# Patient Record
Sex: Male | Born: 1950
Health system: Southern US, Community
[De-identification: ages and names within clinical notes are randomized; demographics above are authoritative.]

## PROBLEM LIST (undated history)

## (undated) DIAGNOSIS — B1920 Unspecified viral hepatitis C without hepatic coma: Secondary | ICD-10-CM

## (undated) DIAGNOSIS — J189 Pneumonia, unspecified organism: Secondary | ICD-10-CM

## (undated) DIAGNOSIS — M1990 Unspecified osteoarthritis, unspecified site: Secondary | ICD-10-CM

## (undated) DIAGNOSIS — Z201 Contact with and (suspected) exposure to tuberculosis: Secondary | ICD-10-CM

## (undated) HISTORY — PX: TOTAL KNEE ARTHROPLASTY: SHX125

## (undated) HISTORY — PX: ABDOMINAL EXPLORATION SURGERY: SHX538

## (undated) HISTORY — PX: JOINT REPLACEMENT: SHX530

## (undated) HISTORY — PX: FRACTURE SURGERY: SHX138

---
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
## 2004-12-31 ENCOUNTER — Emergency Department: Payer: Self-pay | Admitting: Unknown Physician Specialty

## 2016-04-08 ENCOUNTER — Emergency Department: Payer: Medicare Other

## 2016-04-08 ENCOUNTER — Emergency Department
Admission: EM | Admit: 2016-04-08 | Discharge: 2016-04-09 | Disposition: A | Discharge status: Home / Self Care | Payer: Medicare Other | Attending: Emergency Medicine | Admitting: Emergency Medicine

## 2016-04-08 DIAGNOSIS — F29 Unspecified psychosis not due to a substance or known physiological condition: Secondary | ICD-10-CM | POA: Diagnosis not present

## 2016-04-08 DIAGNOSIS — Z5181 Encounter for therapeutic drug level monitoring: Secondary | ICD-10-CM | POA: Insufficient documentation

## 2016-04-08 DIAGNOSIS — R4182 Altered mental status, unspecified: Secondary | ICD-10-CM

## 2016-04-08 DIAGNOSIS — F1910 Other psychoactive substance abuse, uncomplicated: Secondary | ICD-10-CM | POA: Diagnosis not present

## 2016-04-08 LAB — CBC
HEMATOCRIT: 41 % (ref 40.0–52.0)
HEMOGLOBIN: 13.4 g/dL (ref 13.0–18.0)
MCH: 30.9 pg (ref 26.0–34.0)
MCHC: 32.7 g/dL (ref 32.0–36.0)
MCV: 94.6 fL (ref 80.0–100.0)
Platelets: 132 10*3/uL — ABNORMAL LOW (ref 150–440)
RBC: 4.33 MIL/uL — AB (ref 4.40–5.90)
RDW: 15.2 % — AB (ref 11.5–14.5)
WBC: 2.5 10*3/uL — ABNORMAL LOW (ref 3.8–10.6)

## 2016-04-08 MED ORDER — ZIPRASIDONE MESYLATE 20 MG IM SOLR
INTRAMUSCULAR | Status: AC
  Administered 2016-04-08: 20 mg via INTRAMUSCULAR
  Filled 2016-04-08: qty 20

## 2016-04-08 MED ORDER — ZIPRASIDONE MESYLATE 20 MG IM SOLR
20.0000 mg | Freq: Once | INTRAMUSCULAR | Status: AC
  Administered 2016-04-08: 20 mg via INTRAMUSCULAR

## 2016-04-08 NOTE — ED Notes (Signed)
Patient transported to CT with security escort

## 2016-04-08 NOTE — ED Notes (Signed)
Contact info given to family

## 2016-04-08 NOTE — ED Provider Notes (Addendum)
[facility] Emergency Department Provider Note   ____________________________________________    I have reviewed the triage vital signs and the nursing notes.   HISTORY  Chief Complaint Altered Mental Status   History limited  HPI Stephen Rivera is a 66 y.o. male who presents with altered mental status. He is belligerent and aggressive in trying to strike the staff. Per EMS the patient went to the police department and complained about his neighbors and when they asked for more information he became belligerent with the police and left and then promptly fell asleep on a bench outside. Family was reportedly trying to drag him into his car when EMS arrived   No past medical history on file.  There are no active problems to display for this patient.   No past surgical history on file.  Prior to Admission medications   Not on File     Allergies Patient has no known allergies.  No family history on file.  Social History Unavailable because of patient's altered mental status  Level V caveat: Unable to obtain due to altered mental status Review of Systems    ____________________________________________   PHYSICAL EXAM:  VITAL SIGNS: ED Triage Vitals  Enc Vitals Group     BP      Pulse      Resp      Temp      Temp src      SpO2      Weight      Height      Head Circumference      Peak Flow      Pain Score      Pain Loc      Pain Edu?      Excl. in GC?     Constitutional: Alert, Screaming at staff altered mental status thinks that we want to experiment on him Eyes: Conjunctivae are normal.  Head: Atraumatic. Nose: No congestion/rhinnorhea. Mouth/Throat: Mucous membranes are moist.    Cardiovascular: Normal rate, regular rhythm. Grossly normal heart sounds.  Good peripheral circulation. Respiratory: Normal respiratory effort.  No retractions. Lungs CTAB. Gastrointestinal: Soft and nontender. No distention.  No CVA  tenderness.  Musculoskeletal:   Warm and well perfused Neurologic:   No gross focal neurologic deficits are appreciated.  Skin:  Skin is warm, dry and intact. No rash noted. Psychiatric: As above  ____________________________________________   LABS (all labs ordered are listed, but only abnormal results are displayed)  Labs Reviewed - No data to display ____________________________________________  EKG  none ____________________________________________  RADIOLOGY  CT pending ____________________________________________   PROCEDURES  Procedure(s) performed: No    Critical Care performed: No ____________________________________________   INITIAL IMPRESSION / ASSESSMENT AND PLAN / ED COURSE  Pertinent labs & imaging results that were available during my care of the patient were reviewed by me and considered in my medical decision making (see chart for details).  Patient aggressive and threatening towards staff. I will give him 20 mg of Geodon IM for his and our safety. He will require involuntary commitment.  ----------------------------------------- 11:12 PM on 04/08/2016 -----------------------------------------  Patient now resting quietly    ____________________________________________   FINAL CLINICAL IMPRESSION(S) / ED DIAGNOSES  Final diagnoses:  Psychosis, unspecified psychosis type      NEW MEDICATIONS STARTED DURING THIS VISIT:  New Prescriptions   No medications on file     Note:  This document was prepared using Dragon voice recognition software and may include unintentional dictation errors.  Jene Every, MD 04/08/16 0981    Jene Every, MD 04/08/16 [phone]

## 2016-04-09 ENCOUNTER — Encounter: Payer: Self-pay | Admitting: Emergency Medicine

## 2016-04-09 LAB — COMPREHENSIVE METABOLIC PANEL
ALT: 21 U/L (ref 17–63)
AST: 34 U/L (ref 15–41)
Albumin: 4.1 g/dL (ref 3.5–5.0)
Alkaline Phosphatase: 32 U/L — ABNORMAL LOW (ref 38–126)
Anion gap: 12 (ref 5–15)
BILIRUBIN TOTAL: 0.6 mg/dL (ref 0.3–1.2)
BUN: 11 mg/dL (ref 6–20)
CO2: 23 mmol/L (ref 22–32)
CREATININE: 1.22 mg/dL (ref 0.61–1.24)
Calcium: 9 mg/dL (ref 8.9–10.3)
Chloride: 102 mmol/L (ref 101–111)
GFR calc Af Amer: >60 mL/min (ref >60)
GFR calc non Af Amer: >60 mL/min (ref >60)
Glucose, Bld: 87 mg/dL (ref 65–99)
Potassium: 3.3 mmol/L — ABNORMAL LOW (ref 3.5–5.1)
Sodium: 137 mmol/L (ref 135–145)
TOTAL PROTEIN: 7.9 g/dL (ref 6.5–8.1)

## 2016-04-09 LAB — URINE DRUG SCREEN, QUALITATIVE (ARMC ONLY)
Amphetamines, Ur Screen: NOT DETECTED
BARBITURATES, UR SCREEN: NOT DETECTED
BENZODIAZEPINE, UR SCRN: NOT DETECTED
CANNABINOID 50 NG, UR ~~LOC~~: NOT DETECTED
Cocaine Metabolite,Ur ~~LOC~~: POSITIVE — AB
MDMA (Ecstasy)Ur Screen: NOT DETECTED
Methadone Scn, Ur: NOT DETECTED
OPIATE, UR SCREEN: NOT DETECTED
PHENCYCLIDINE (PCP) UR S: NOT DETECTED
Tricyclic, Ur Screen: NOT DETECTED

## 2016-04-09 LAB — TROPONIN I: Troponin I: <0.03 ng/mL (ref <0.03)

## 2016-04-09 LAB — CK: Total CK: 241 U/L (ref 49–397)

## 2016-04-09 LAB — ETHANOL: Alcohol, Ethyl (B): 16 mg/dL — ABNORMAL HIGH (ref <5)

## 2016-04-09 LAB — ACETAMINOPHEN LEVEL: Acetaminophen (Tylenol), Serum: <10 ug/mL — ABNORMAL LOW (ref 10–30)

## 2016-04-09 NOTE — ED Notes (Signed)
Pt sleeping soundly, even and unlabored respirations, will continue to monitor  

## 2016-04-09 NOTE — Progress Notes (Signed)
TTS spoke with RN Danelle Earthly, who spoke with ER MD who is requesting to wait on TTS as pt is still asleep and the would like to allow him to sleep as long as he can and assess on day shift. Garner Nash, MSW, LCSW Clinical Social Worker 04/09/2016 5:52 AM

## 2016-04-09 NOTE — ED Triage Notes (Signed)
Patient presents to Emergency Department via [facility] with complaints of being unresponsive in the [facility] [employer] office.  Per EMS pt came into the police office to report "people drug dealing"; then became belligerent (cussing) when LE attempted to help, finally entering a state of near catatonia.  EMS reports  CBG 104, BP 146/73, 58 NSR, withdrew from axial temp probe.    Pt withdraws from pain.

## 2016-04-09 NOTE — ED Notes (Signed)
Pt awake , calm, cooperative, even unlabored respiration, skin warm and dry. No distress , breakfast tray given

## 2016-04-09 NOTE — Progress Notes (Signed)
TTS spoke with RN for pt around 2330: pt was being restrained by multiple ED staff.  Pt subsequently given geodon.  TTS went and check on pt around 0200 and pt still sleeping. Daleen Squibb, LCSW, TTS counselor

## 2016-04-09 NOTE — Discharge Instructions (Signed)
Your urine tested positive for cocaine. Please make sure you're not using any drugs. If [facility] need help [facility] excellent [facility] to help [facility] with [facility]. Please follow-up with your doctor at the [facility] to check the 2 spots that turned up on your chest x-ray. [facility]. Please return here for any further problems

## 2016-04-09 NOTE — ED Notes (Addendum)
While attempting to draw blood pt suddenly woke and began cussing, kicking and swatting at staff.  Marcelino Duster, RN and RN student Delice Bison were the first to be swatted at, pt's extremities were safely restrained and attempts were repeatedly made to orient pt to care.  Pt continuously screamed that staff was torturing him, "doing experiments" and "trying to kill me", pt reports that he "won the lottery" to get in the [employer], answers all questions with "what the fuck do you think?",   pt labile, unresponsive then very active, aggressive and verbally abusive, paranoidal acussations and fabrications  Dr Cyril Loosen aware, orders recieved

## 2016-04-09 NOTE — ED Notes (Signed)
Pt lying calmly without intervention, appears to be more relaxed
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
## 2016-04-09 NOTE — ED Notes (Signed)
Greg, TTS, called, per EDP allow pt to sleep, assess once awake

## 2016-04-09 NOTE — ED Notes (Signed)
Pt with snoring respirations, even unlabored respirations no distress

## 2016-04-09 NOTE — ED Notes (Signed)
SOC consult in progress.  

## 2016-04-09 NOTE — ED Notes (Signed)
Pt changed out into paper scrubs, pt calm/cooperative, blue jeans, boxer shorts, brown belt ,  Two silver colored class ring type rings, black/red sweat shirt left sleeve torn, white under shirt long sleeve with left sleeve torn, short sleeve t-shirt intact. Pt requested that the two rings be kept with his bag of belongings

## 2016-04-09 NOTE — ED Notes (Signed)
Lunch tray provided. 

## 2016-04-09 NOTE — ED Notes (Signed)
SOC report received , IVC reversal is expected ,pt currently calm cooperative, alert and oriented

## 2016-04-09 NOTE — ED Notes (Signed)
Calvin TTS speaking with patient , pt remains calm and cooperative , no distress
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
## 2016-12-10 ENCOUNTER — Emergency Department (HOSPITAL_COMMUNITY): Payer: Medicare Other

## 2016-12-10 ENCOUNTER — Other Ambulatory Visit: Payer: Self-pay

## 2016-12-10 ENCOUNTER — Inpatient Hospital Stay (HOSPITAL_COMMUNITY): Payer: Medicare Other

## 2016-12-10 ENCOUNTER — Inpatient Hospital Stay (HOSPITAL_COMMUNITY)
Admission: EM | Admit: 2016-12-10 | Discharge: 2016-12-19 | DRG: 956 | Disposition: A | Discharge status: Skilled Nursing Facility | Payer: Medicare Other | Attending: Student | Admitting: Student

## 2016-12-10 ENCOUNTER — Encounter (HOSPITAL_COMMUNITY): Payer: Self-pay | Admitting: Radiology

## 2016-12-10 DIAGNOSIS — R0902 Hypoxemia: Secondary | ICD-10-CM | POA: Diagnosis not present

## 2016-12-10 DIAGNOSIS — S2232XA Fracture of one rib, left side, initial encounter for closed fracture: Secondary | ICD-10-CM | POA: Diagnosis present

## 2016-12-10 DIAGNOSIS — R0602 Shortness of breath: Secondary | ICD-10-CM | POA: Diagnosis not present

## 2016-12-10 DIAGNOSIS — S42009A Fracture of unspecified part of unspecified clavicle, initial encounter for closed fracture: Secondary | ICD-10-CM | POA: Diagnosis not present

## 2016-12-10 DIAGNOSIS — S82202A Unspecified fracture of shaft of left tibia, initial encounter for closed fracture: Secondary | ICD-10-CM

## 2016-12-10 DIAGNOSIS — T148XXA Other injury of unspecified body region, initial encounter: Secondary | ICD-10-CM

## 2016-12-10 DIAGNOSIS — S0990XA Unspecified injury of head, initial encounter: Secondary | ICD-10-CM | POA: Diagnosis not present

## 2016-12-10 DIAGNOSIS — G8911 Acute pain due to trauma: Secondary | ICD-10-CM | POA: Diagnosis not present

## 2016-12-10 DIAGNOSIS — S82102A Unspecified fracture of upper end of left tibia, initial encounter for closed fracture: Secondary | ICD-10-CM | POA: Diagnosis not present

## 2016-12-10 DIAGNOSIS — S79911A Unspecified injury of right hip, initial encounter: Secondary | ICD-10-CM | POA: Diagnosis present

## 2016-12-10 DIAGNOSIS — S199XXA Unspecified injury of neck, initial encounter: Secondary | ICD-10-CM | POA: Diagnosis not present

## 2016-12-10 DIAGNOSIS — M1990 Unspecified osteoarthritis, unspecified site: Secondary | ICD-10-CM | POA: Diagnosis present

## 2016-12-10 DIAGNOSIS — M978XXA Periprosthetic fracture around other internal prosthetic joint, initial encounter: Secondary | ICD-10-CM

## 2016-12-10 DIAGNOSIS — Z96652 Presence of left artificial knee joint: Secondary | ICD-10-CM | POA: Diagnosis present

## 2016-12-10 DIAGNOSIS — S82252A Displaced comminuted fracture of shaft of left tibia, initial encounter for closed fracture: Secondary | ICD-10-CM | POA: Diagnosis not present

## 2016-12-10 DIAGNOSIS — D649 Anemia, unspecified: Secondary | ICD-10-CM | POA: Diagnosis not present

## 2016-12-10 DIAGNOSIS — S72141A Displaced intertrochanteric fracture of right femur, initial encounter for closed fracture: Secondary | ICD-10-CM | POA: Diagnosis not present

## 2016-12-10 DIAGNOSIS — F10129 Alcohol abuse with intoxication, unspecified: Secondary | ICD-10-CM | POA: Diagnosis present

## 2016-12-10 DIAGNOSIS — S82452A Displaced comminuted fracture of shaft of left fibula, initial encounter for closed fracture: Secondary | ICD-10-CM | POA: Diagnosis present

## 2016-12-10 DIAGNOSIS — S7290XA Unspecified fracture of unspecified femur, initial encounter for closed fracture: Secondary | ICD-10-CM

## 2016-12-10 DIAGNOSIS — S299XXA Unspecified injury of thorax, initial encounter: Secondary | ICD-10-CM | POA: Diagnosis not present

## 2016-12-10 DIAGNOSIS — S82402A Unspecified fracture of shaft of left fibula, initial encounter for closed fracture: Secondary | ICD-10-CM | POA: Diagnosis not present

## 2016-12-10 DIAGNOSIS — Z419 Encounter for procedure for purposes other than remedying health state, unspecified: Secondary | ICD-10-CM

## 2016-12-10 DIAGNOSIS — S3991XA Unspecified injury of abdomen, initial encounter: Secondary | ICD-10-CM | POA: Diagnosis not present

## 2016-12-10 DIAGNOSIS — M79605 Pain in left leg: Secondary | ICD-10-CM | POA: Diagnosis not present

## 2016-12-10 DIAGNOSIS — J939 Pneumothorax, unspecified: Secondary | ICD-10-CM

## 2016-12-10 DIAGNOSIS — S42017A Nondisplaced fracture of sternal end of right clavicle, initial encounter for closed fracture: Secondary | ICD-10-CM | POA: Diagnosis not present

## 2016-12-10 DIAGNOSIS — S82831A Other fracture of upper and lower end of right fibula, initial encounter for closed fracture: Secondary | ICD-10-CM | POA: Diagnosis not present

## 2016-12-10 DIAGNOSIS — S270XXA Traumatic pneumothorax, initial encounter: Principal | ICD-10-CM | POA: Diagnosis present

## 2016-12-10 DIAGNOSIS — Y92410 Unspecified street and highway as the place of occurrence of the external cause: Secondary | ICD-10-CM | POA: Diagnosis not present

## 2016-12-10 DIAGNOSIS — S99922A Unspecified injury of left foot, initial encounter: Secondary | ICD-10-CM | POA: Diagnosis not present

## 2016-12-10 DIAGNOSIS — Z96659 Presence of unspecified artificial knee joint: Secondary | ICD-10-CM

## 2016-12-10 DIAGNOSIS — M9712XA Periprosthetic fracture around internal prosthetic left knee joint, initial encounter: Secondary | ICD-10-CM | POA: Diagnosis present

## 2016-12-10 DIAGNOSIS — S161XXA Strain of muscle, fascia and tendon at neck level, initial encounter: Secondary | ICD-10-CM

## 2016-12-10 DIAGNOSIS — S72009A Fracture of unspecified part of neck of unspecified femur, initial encounter for closed fracture: Secondary | ICD-10-CM | POA: Diagnosis present

## 2016-12-10 DIAGNOSIS — S728X9A Other fracture of unspecified femur, initial encounter for closed fracture: Secondary | ICD-10-CM | POA: Diagnosis not present

## 2016-12-10 DIAGNOSIS — F10929 Alcohol use, unspecified with intoxication, unspecified: Secondary | ICD-10-CM

## 2016-12-10 DIAGNOSIS — S2249XA Multiple fractures of ribs, unspecified side, initial encounter for closed fracture: Secondary | ICD-10-CM

## 2016-12-10 DIAGNOSIS — M25551 Pain in right hip: Secondary | ICD-10-CM | POA: Diagnosis not present

## 2016-12-10 DIAGNOSIS — D696 Thrombocytopenia, unspecified: Secondary | ICD-10-CM | POA: Diagnosis not present

## 2016-12-10 DIAGNOSIS — S2242XA Multiple fractures of ribs, left side, initial encounter for closed fracture: Secondary | ICD-10-CM | POA: Diagnosis not present

## 2016-12-10 DIAGNOSIS — S72001A Fracture of unspecified part of neck of right femur, initial encounter for closed fracture: Secondary | ICD-10-CM

## 2016-12-10 DIAGNOSIS — S42013A Anterior displaced fracture of sternal end of unspecified clavicle, initial encounter for closed fracture: Secondary | ICD-10-CM | POA: Diagnosis not present

## 2016-12-10 DIAGNOSIS — R0789 Other chest pain: Secondary | ICD-10-CM | POA: Diagnosis not present

## 2016-12-10 DIAGNOSIS — S82832A Other fracture of upper and lower end of left fibula, initial encounter for closed fracture: Secondary | ICD-10-CM | POA: Diagnosis not present

## 2016-12-10 DIAGNOSIS — Z23 Encounter for immunization: Secondary | ICD-10-CM

## 2016-12-10 DIAGNOSIS — R918 Other nonspecific abnormal finding of lung field: Secondary | ICD-10-CM | POA: Diagnosis not present

## 2016-12-10 DIAGNOSIS — S72011A Unspecified intracapsular fracture of right femur, initial encounter for closed fracture: Secondary | ICD-10-CM | POA: Diagnosis not present

## 2016-12-10 DIAGNOSIS — T1490XA Injury, unspecified, initial encounter: Secondary | ICD-10-CM

## 2016-12-10 DIAGNOSIS — S82155A Nondisplaced fracture of left tibial tuberosity, initial encounter for closed fracture: Secondary | ICD-10-CM | POA: Diagnosis not present

## 2016-12-10 DIAGNOSIS — S42001A Fracture of unspecified part of right clavicle, initial encounter for closed fracture: Secondary | ICD-10-CM | POA: Diagnosis not present

## 2016-12-10 HISTORY — DX: Unspecified osteoarthritis, unspecified site: M19.90

## 2016-12-10 HISTORY — DX: Contact with and (suspected) exposure to tuberculosis: Z20.1

## 2016-12-10 HISTORY — DX: Pneumonia, unspecified organism: J18.9

## 2016-12-10 HISTORY — DX: Unspecified viral hepatitis C without hepatic coma: B19.20

## 2016-12-10 LAB — I-STAT CHEM 8, ED
BUN: 20 mg/dL (ref 6–20)
CALCIUM ION: 0.99 mmol/L — AB (ref 1.15–1.40)
CHLORIDE: 102 mmol/L (ref 101–111)
Creatinine, Ser: 1.2 mg/dL (ref 0.61–1.24)
Glucose, Bld: 101 mg/dL — ABNORMAL HIGH (ref 65–99)
HEMATOCRIT: 44 % (ref 39.0–52.0)
Hemoglobin: 15 g/dL (ref 13.0–17.0)
POTASSIUM: 4.6 mmol/L (ref 3.5–5.1)
SODIUM: 138 mmol/L (ref 135–145)
TCO2: 25 mmol/L (ref 22–32)

## 2016-12-10 LAB — COMPREHENSIVE METABOLIC PANEL
ALBUMIN: 3.7 g/dL (ref 3.5–5.0)
ALK PHOS: 53 U/L (ref 38–126)
ALT: 39 U/L (ref 17–63)
ANION GAP: 13 (ref 5–15)
AST: 97 U/L — ABNORMAL HIGH (ref 15–41)
BILIRUBIN TOTAL: 0.6 mg/dL (ref 0.3–1.2)
BUN: 13 mg/dL (ref 6–20)
CALCIUM: 8.8 mg/dL — AB (ref 8.9–10.3)
CO2: 24 mmol/L (ref 22–32)
Chloride: 99 mmol/L — ABNORMAL LOW (ref 101–111)
Creatinine, Ser: 1.04 mg/dL (ref 0.61–1.24)
GFR calc Af Amer: >60 mL/min (ref >60)
GFR calc non Af Amer: >60 mL/min (ref >60)
GLUCOSE: 133 mg/dL — AB (ref 65–99)
Potassium: 3.5 mmol/L (ref 3.5–5.1)
SODIUM: 136 mmol/L (ref 135–145)
Total Protein: 7.7 g/dL (ref 6.5–8.1)

## 2016-12-10 LAB — CBC
HCT: 41 % (ref 39.0–52.0)
HEMOGLOBIN: 13.7 g/dL (ref 13.0–17.0)
MCH: 30 pg (ref 26.0–34.0)
MCHC: 33.4 g/dL (ref 30.0–36.0)
MCV: 89.9 fL (ref 78.0–100.0)
Platelets: 154 10*3/uL (ref 150–400)
RBC: 4.56 MIL/uL (ref 4.22–5.81)
RDW: 14 % (ref 11.5–15.5)
WBC: 10.1 10*3/uL (ref 4.0–10.5)

## 2016-12-10 LAB — I-STAT CG4 LACTIC ACID, ED: Lactic Acid, Venous: 4.79 mmol/L (ref 0.5–1.9)

## 2016-12-10 LAB — ETHANOL: ALCOHOL ETHYL (B): 223 mg/dL — AB (ref <10)

## 2016-12-10 LAB — SAMPLE TO BLOOD BANK

## 2016-12-10 LAB — CG4 I-STAT (LACTIC ACID): Lactic Acid, Venous: 3.53 mmol/L (ref 0.5–1.9)

## 2016-12-10 LAB — PROTIME-INR
INR: 1.1
Prothrombin Time: 14.1 seconds (ref 11.4–15.2)

## 2016-12-10 LAB — I-STAT TROPONIN, ED: TROPONIN I, POC: 0.08 ng/mL (ref 0.00–0.08)

## 2016-12-10 MED ORDER — ROCURONIUM BROMIDE 10 MG/ML (PF) SYRINGE
PREFILLED_SYRINGE | INTRAVENOUS | Status: AC
  Filled 2016-12-10: qty 10

## 2016-12-10 MED ORDER — HYDROMORPHONE HCL 1 MG/ML IJ SOLN
1.0000 mg | INTRAMUSCULAR | Status: DC | PRN
  Administered 2016-12-10 (×3): 1 mg via INTRAVENOUS
  Filled 2016-12-10 (×3): qty 1

## 2016-12-10 MED ORDER — ONDANSETRON 4 MG PO TBDP
4.0000 mg | ORAL_TABLET | Freq: Four times a day (QID) | ORAL | Status: DC | PRN
  Filled 2016-12-10: qty 1

## 2016-12-10 MED ORDER — PROPOFOL 10 MG/ML IV BOLUS
INTRAVENOUS | Status: AC
  Filled 2016-12-10: qty 20

## 2016-12-10 MED ORDER — PHENYLEPHRINE 40 MCG/ML (10ML) SYRINGE FOR IV PUSH (FOR BLOOD PRESSURE SUPPORT)
PREFILLED_SYRINGE | INTRAVENOUS | Status: AC
  Filled 2016-12-10: qty 10

## 2016-12-10 MED ORDER — LIDOCAINE 2% (20 MG/ML) 5 ML SYRINGE
INTRAMUSCULAR | Status: AC
  Filled 2016-12-10: qty 5

## 2016-12-10 MED ORDER — ONDANSETRON HCL 4 MG/2ML IJ SOLN: 4.0000 mg | Freq: Four times a day (QID) | INTRAMUSCULAR | Status: DC | PRN

## 2016-12-10 MED ORDER — MIDAZOLAM HCL 2 MG/2ML IJ SOLN
INTRAMUSCULAR | Status: AC
  Filled 2016-12-10: qty 2

## 2016-12-10 MED ORDER — SODIUM CHLORIDE 0.9 % IV BOLUS (SEPSIS)
1000.0000 mL | Freq: Once | INTRAVENOUS | Status: AC
  Administered 2016-12-10: 1000 mL via INTRAVENOUS

## 2016-12-10 MED ORDER — ONDANSETRON HCL 4 MG/2ML IJ SOLN
INTRAMUSCULAR | Status: AC
  Filled 2016-12-10: qty 2

## 2016-12-10 MED ORDER — DEXTROSE-NACL 5-0.9 % IV SOLN
INTRAVENOUS | Status: DC
  Administered 2016-12-10 – 2016-12-11 (×3): via INTRAVENOUS

## 2016-12-10 MED ORDER — HYDRALAZINE HCL 20 MG/ML IJ SOLN: 10.0000 mg | INTRAMUSCULAR | Status: DC | PRN

## 2016-12-10 MED ORDER — IOPAMIDOL (ISOVUE-300) INJECTION 61%
INTRAVENOUS | Status: AC
  Administered 2016-12-10: 80 mL
  Filled 2016-12-10: qty 100

## 2016-12-10 MED ORDER — FENTANYL CITRATE (PF) 250 MCG/5ML IJ SOLN
INTRAMUSCULAR | Status: AC
  Filled 2016-12-10: qty 5

## 2016-12-10 MED ORDER — TRAMADOL HCL 50 MG PO TABS
50.0000 mg | ORAL_TABLET | Freq: Four times a day (QID) | ORAL | Status: DC | PRN
  Administered 2016-12-16 – 2016-12-19 (×4): 50 mg via ORAL
  Filled 2016-12-10 (×4): qty 1

## 2016-12-10 NOTE — ED Notes (Signed)
Pt verbalized understanding discharge instructions and denies any further needs or questions at this time. VS stable, ambulatory and steady gait.   
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
## 2016-12-10 NOTE — ED Notes (Signed)
Pt's SPO2 has been declining from 100% down to the 70's and back up. Pt A&Ox4. SPO2 probe has been changed from R to L hand and is now on on Pt's L ear.  Pt's waveform doesn't match his pulse. Pt placed back on 2 L [place] and will be monitored.

## 2016-12-10 NOTE — Anesthesia Preprocedure Evaluation (Addendum)
Anesthesia Evaluation  Patient identified by MRN, date of birth, ID band Patient awake    Reviewed: Allergy & Precautions, NPO status , Patient's Chart, lab work & pertinent test results  Airway Mallampati: II  TM Distance: >3 FB Neck ROM: Limited   Comment: C-collar Dental  (+) Edentulous Upper, Edentulous Lower   Pulmonary neg pulmonary ROS,    Pulmonary exam normal breath sounds clear to auscultation       Cardiovascular negative cardio ROS Normal cardiovascular exam Rhythm:Regular Rate:Normal     Neuro/Psych C-collar in place    GI/Hepatic negative GI ROS, Neg liver ROS,   Endo/Other  negative endocrine ROS  Renal/GU negative Renal ROS     Musculoskeletal  (+) Arthritis , Osteoarthritis,  Right intertrochanteric femur, left tibia fracture   Abdominal   Peds  Hematology negative hematology ROS (+) Plt 154k   Anesthesia Other Findings Day of surgery medications reviewed with the patient.  Reproductive/Obstetrics                            Anesthesia Physical Anesthesia Plan  ASA: II and emergent  Anesthesia Plan: General   Post-op Pain Management:    Induction: Intravenous  PONV Risk Score and Plan: 3 and Midazolam, Dexamethasone and Ondansetron  Airway Management Planned: Oral ETT  Additional Equipment:   Intra-op Plan:   Post-operative Plan: Extubation in OR  Informed Consent: I have reviewed the patients History and Physical, chart, labs and discussed the procedure including the risks, benefits and alternatives for the proposed anesthesia with the patient or authorized representative who has indicated his/her understanding and acceptance.   Dental advisory given  Plan Discussed with: CRNA  Anesthesia Plan Comments: (Risks/benefits of general anesthesia discussed with patient including risk of damage to teeth, lips, gum, and tongue, nausea/vomiting, allergic reactions  to medications, and the possibility of heart attack, stroke and death.  All patient questions answered.  Patient wishes to proceed.)       Anesthesia Quick Evaluation
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
## 2016-12-10 NOTE — ED Notes (Signed)
Portable xray at bedside.
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
## 2016-12-10 NOTE — ED Notes (Signed)
Dr ray at bedside to attempt IV ultrasound
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
## 2016-12-10 NOTE — ED Notes (Signed)
Patient transported to CT 
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
## 2016-12-10 NOTE — Progress Notes (Signed)
Orthopedic Tech Progress Note Patient Details:  Stephen Rivera 05/07/1950 [id number]  Musculoskeletal Traction Type of Traction: Bucks Skin Traction Traction Location: rle Traction Weight: 10 lbs    Trinna PostMartinez, Osman Calzadilla J 12/10/2016, 8:40 PM

## 2016-12-10 NOTE — ED Notes (Signed)
Pt ready for transport

## 2016-12-10 NOTE — Progress Notes (Signed)
Orthopedic Tech Progress Note Patient Details:  Damita Dunningsed M Devers 08/27/1950 [id number]  Ortho Devices Type of Ortho Device: Ace wrap, Long leg splint Ortho Device/Splint Interventions: Application   Saul FordyceJennifer C Taysia Rivere 12/10/2016, 8:04 AM

## 2016-12-11 ENCOUNTER — Inpatient Hospital Stay (HOSPITAL_COMMUNITY): Payer: Medicare Other

## 2016-12-11 ENCOUNTER — Encounter (HOSPITAL_COMMUNITY): Admission: EM | Disposition: A | Discharge status: Skilled Nursing Facility | Payer: Self-pay | Source: Home / Self Care

## 2016-12-11 ENCOUNTER — Encounter (HOSPITAL_COMMUNITY): Payer: Self-pay | Admitting: *Deleted

## 2016-12-11 ENCOUNTER — Inpatient Hospital Stay (HOSPITAL_COMMUNITY): Payer: Medicare Other | Admitting: Anesthesiology

## 2016-12-11 HISTORY — PX: INTRAMEDULLARY (IM) NAIL INTERTROCHANTERIC: SHX5875

## 2016-12-11 LAB — CBC
HCT: 32.1 % — ABNORMAL LOW (ref 39.0–52.0)
HEMATOCRIT: 30.2 % — AB (ref 39.0–52.0)
Hemoglobin: 10.5 g/dL — ABNORMAL LOW (ref 13.0–17.0)
Hemoglobin: 9.6 g/dL — ABNORMAL LOW (ref 13.0–17.0)
MCH: 29.2 pg (ref 26.0–34.0)
MCH: 29 pg (ref 26.0–34.0)
MCHC: 32.7 g/dL (ref 30.0–36.0)
MCHC: 31.8 g/dL (ref 30.0–36.0)
MCV: 89.4 fL (ref 78.0–100.0)
MCV: 91.2 fL (ref 78.0–100.0)
PLATELETS: 152 10*3/uL (ref 150–400)
PLATELETS: 135 10*3/uL — AB (ref 150–400)
RBC: 3.59 MIL/uL — AB (ref 4.22–5.81)
RBC: 3.31 MIL/uL — ABNORMAL LOW (ref 4.22–5.81)
RDW: 13.8 % (ref 11.5–15.5)
RDW: 13.8 % (ref 11.5–15.5)
WBC: 5.8 10*3/uL (ref 4.0–10.5)
WBC: 11.7 10*3/uL — AB (ref 4.0–10.5)

## 2016-12-11 LAB — SURGICAL PCR SCREEN
MRSA, PCR: NEGATIVE
STAPHYLOCOCCUS AUREUS: NEGATIVE

## 2016-12-11 LAB — COMPREHENSIVE METABOLIC PANEL
ALT: 31 U/L (ref 17–63)
AST: 75 U/L — AB (ref 15–41)
Albumin: 3.3 g/dL — ABNORMAL LOW (ref 3.5–5.0)
Alkaline Phosphatase: 41 U/L (ref 38–126)
Anion gap: 8 (ref 5–15)
BUN: 15 mg/dL (ref 6–20)
CHLORIDE: 101 mmol/L (ref 101–111)
CO2: 27 mmol/L (ref 22–32)
Calcium: 8.3 mg/dL — ABNORMAL LOW (ref 8.9–10.3)
Creatinine, Ser: 1.06 mg/dL (ref 0.61–1.24)
Glucose, Bld: 145 mg/dL — ABNORMAL HIGH (ref 65–99)
POTASSIUM: 4.3 mmol/L (ref 3.5–5.1)
SODIUM: 136 mmol/L (ref 135–145)
Total Bilirubin: 0.6 mg/dL (ref 0.3–1.2)
Total Protein: 6.4 g/dL — ABNORMAL LOW (ref 6.5–8.1)

## 2016-12-11 LAB — HIV ANTIBODY (ROUTINE TESTING W REFLEX): HIV SCREEN 4TH GENERATION: NONREACTIVE

## 2016-12-11 LAB — ABO/RH: ABO/RH(D): A POS

## 2016-12-11 SURGERY — FIXATION, FRACTURE, INTERTROCHANTERIC, WITH INTRAMEDULLARY ROD
Anesthesia: General | Site: Hip | Laterality: Right

## 2016-12-11 MED ORDER — DEXAMETHASONE SODIUM PHOSPHATE 10 MG/ML IJ SOLN
INTRAMUSCULAR | Status: DC | PRN
  Administered 2016-12-11: 10 mg via INTRAVENOUS

## 2016-12-11 MED ORDER — LORAZEPAM 2 MG/ML IJ SOLN: 1.0000 mg | Freq: Four times a day (QID) | INTRAMUSCULAR | Status: AC | PRN

## 2016-12-11 MED ORDER — ONDANSETRON HCL 4 MG/2ML IJ SOLN
INTRAMUSCULAR | Status: DC | PRN
  Administered 2016-12-11: 4 mg via INTRAVENOUS

## 2016-12-11 MED ORDER — FENTANYL CITRATE (PF) 250 MCG/5ML IJ SOLN
INTRAMUSCULAR | Status: DC | PRN
  Administered 2016-12-11 (×2): 50 ug via INTRAVENOUS
  Administered 2016-12-11: 100 ug via INTRAVENOUS
  Administered 2016-12-11 (×5): 50 ug via INTRAVENOUS

## 2016-12-11 MED ORDER — VANCOMYCIN HCL 1000 MG IV SOLR
INTRAVENOUS | Status: AC
  Filled 2016-12-11: qty 1000

## 2016-12-11 MED ORDER — VANCOMYCIN HCL 500 MG IV SOLR
INTRAVENOUS | Status: AC
  Filled 2016-12-11: qty 500

## 2016-12-11 MED ORDER — DEXAMETHASONE SODIUM PHOSPHATE 10 MG/ML IJ SOLN
INTRAMUSCULAR | Status: AC
  Filled 2016-12-11: qty 1

## 2016-12-11 MED ORDER — LACTATED RINGERS IV SOLN
INTRAVENOUS | Status: DC
  Administered 2016-12-11: 16:00:00 via INTRAVENOUS

## 2016-12-11 MED ORDER — FENTANYL CITRATE (PF) 250 MCG/5ML IJ SOLN
INTRAMUSCULAR | Status: AC
  Filled 2016-12-11: qty 5

## 2016-12-11 MED ORDER — THIAMINE HCL 100 MG/ML IJ SOLN
100.0000 mg | Freq: Every day | INTRAMUSCULAR | Status: DC
  Administered 2016-12-19: 100 mg via INTRAVENOUS
  Filled 2016-12-11 (×2): qty 2

## 2016-12-11 MED ORDER — LIDOCAINE 2% (20 MG/ML) 5 ML SYRINGE
INTRAMUSCULAR | Status: AC
  Filled 2016-12-11: qty 5

## 2016-12-11 MED ORDER — ACETAMINOPHEN 325 MG PO TABS
650.0000 mg | ORAL_TABLET | Freq: Four times a day (QID) | ORAL | Status: DC | PRN
  Administered 2016-12-11 – 2016-12-18 (×5): 650 mg via ORAL
  Filled 2016-12-11 (×5): qty 2

## 2016-12-11 MED ORDER — PHENYLEPHRINE HCL 10 MG/ML IJ SOLN
INTRAMUSCULAR | Status: DC | PRN
  Administered 2016-12-11: 120 ug via INTRAVENOUS

## 2016-12-11 MED ORDER — PROPOFOL 10 MG/ML IV BOLUS
INTRAVENOUS | Status: AC
  Filled 2016-12-11: qty 20

## 2016-12-11 MED ORDER — PROPOFOL 10 MG/ML IV BOLUS
INTRAVENOUS | Status: DC | PRN
  Administered 2016-12-11: 200 mg via INTRAVENOUS

## 2016-12-11 MED ORDER — ROCURONIUM BROMIDE 10 MG/ML (PF) SYRINGE
PREFILLED_SYRINGE | INTRAVENOUS | Status: DC | PRN
  Administered 2016-12-11: 30 mg via INTRAVENOUS
  Administered 2016-12-11: 20 mg via INTRAVENOUS
  Administered 2016-12-11: 50 mg via INTRAVENOUS

## 2016-12-11 MED ORDER — ONDANSETRON HCL 4 MG/2ML IJ SOLN
INTRAMUSCULAR | Status: AC
  Filled 2016-12-11: qty 2

## 2016-12-11 MED ORDER — VITAMIN B-1 100 MG PO TABS
100.0000 mg | ORAL_TABLET | Freq: Every day | ORAL | Status: DC
  Administered 2016-12-11 – 2016-12-18 (×7): 100 mg via ORAL
  Filled 2016-12-11 (×8): qty 1

## 2016-12-11 MED ORDER — ADULT MULTIVITAMIN W/MINERALS CH
1.0000 | ORAL_TABLET | Freq: Every day | ORAL | Status: DC
  Administered 2016-12-12 – 2016-12-19 (×7): 1 via ORAL
  Filled 2016-12-11 (×7): qty 1

## 2016-12-11 MED ORDER — MIDAZOLAM HCL 2 MG/2ML IJ SOLN
INTRAMUSCULAR | Status: AC
  Filled 2016-12-11: qty 2

## 2016-12-11 MED ORDER — CEFAZOLIN SODIUM-DEXTROSE 1-4 GM/50ML-% IV SOLN
INTRAVENOUS | Status: DC | PRN
  Administered 2016-12-11: 2 g via INTRAVENOUS

## 2016-12-11 MED ORDER — LACTATED RINGERS IV SOLN
INTRAVENOUS | Status: DC | PRN
  Administered 2016-12-11 (×3): via INTRAVENOUS

## 2016-12-11 MED ORDER — ONDANSETRON HCL 4 MG/2ML IJ SOLN: 4.0000 mg | Freq: Once | INTRAMUSCULAR | Status: DC | PRN

## 2016-12-11 MED ORDER — IOPAMIDOL (ISOVUE-370) INJECTION 76%
INTRAVENOUS | Status: AC
  Administered 2016-12-11: 100 mL
  Filled 2016-12-11: qty 100

## 2016-12-11 MED ORDER — 0.9 % SODIUM CHLORIDE (POUR BTL) OPTIME
TOPICAL | Status: DC | PRN
  Administered 2016-12-11: 1000 mL

## 2016-12-11 MED ORDER — ROCURONIUM BROMIDE 10 MG/ML (PF) SYRINGE
PREFILLED_SYRINGE | INTRAVENOUS | Status: AC
  Filled 2016-12-11: qty 5

## 2016-12-11 MED ORDER — FENTANYL CITRATE (PF) 100 MCG/2ML IJ SOLN
25.0000 ug | INTRAMUSCULAR | Status: DC | PRN
  Administered 2016-12-11 (×4): 25 ug via INTRAVENOUS

## 2016-12-11 MED ORDER — LIDOCAINE 2% (20 MG/ML) 5 ML SYRINGE
INTRAMUSCULAR | Status: DC | PRN
  Administered 2016-12-11: 100 mg via INTRAVENOUS

## 2016-12-11 MED ORDER — FENTANYL CITRATE (PF) 100 MCG/2ML IJ SOLN
INTRAMUSCULAR | Status: AC
  Filled 2016-12-11: qty 2

## 2016-12-11 MED ORDER — SUCCINYLCHOLINE CHLORIDE 200 MG/10ML IV SOSY
PREFILLED_SYRINGE | INTRAVENOUS | Status: AC
  Filled 2016-12-11: qty 10

## 2016-12-11 MED ORDER — LORAZEPAM 1 MG PO TABS: 1.0000 mg | ORAL_TABLET | Freq: Four times a day (QID) | ORAL | Status: AC | PRN

## 2016-12-11 MED ORDER — FOLIC ACID 1 MG PO TABS
1.0000 mg | ORAL_TABLET | Freq: Every day | ORAL | Status: DC
  Administered 2016-12-11 – 2016-12-19 (×8): 1 mg via ORAL
  Filled 2016-12-11 (×8): qty 1

## 2016-12-11 MED ORDER — SUGAMMADEX SODIUM 200 MG/2ML IV SOLN
INTRAVENOUS | Status: DC | PRN
  Administered 2016-12-11: 200 mg via INTRAVENOUS

## 2016-12-11 MED ORDER — ACETAMINOPHEN 650 MG RE SUPP: 650.0000 mg | Freq: Four times a day (QID) | RECTAL | Status: DC | PRN

## 2016-12-11 MED ORDER — VANCOMYCIN HCL POWD
Status: DC | PRN
  Administered 2016-12-11: 1500 mg

## 2016-12-11 SURGICAL SUPPLY — 80 items
BANDAGE ACE 4X5 VEL STRL LF (GAUZE/BANDAGES/DRESSINGS) IMPLANT
BANDAGE ACE 6X5 VEL STRL LF (GAUZE/BANDAGES/DRESSINGS) ×4 IMPLANT
BANDAGE ESMARK 6X9 LF (GAUZE/BANDAGES/DRESSINGS) IMPLANT
BIT DRILL CANN LRG QC 5X300 (BIT) ×4 IMPLANT
BIT DRILL FLUTED FEMUR 4.2/3 (BIT) ×4 IMPLANT
BLADE CLIPPER SURG (BLADE) IMPLANT
BLADE HELICAL TFNA 90 HIP (Anchor) ×3 IMPLANT
BLADE HELICAL TFNA 90MM HIP (Anchor) ×1 IMPLANT
BNDG COHESIVE 4X5 TAN STRL (GAUZE/BANDAGES/DRESSINGS) IMPLANT
BNDG ELASTIC 6X10 VLCR STRL LF (GAUZE/BANDAGES/DRESSINGS) ×4 IMPLANT
BNDG ESMARK 6X9 LF (GAUZE/BANDAGES/DRESSINGS)
BNDG GAUZE ELAST 4 BULKY (GAUZE/BANDAGES/DRESSINGS) IMPLANT
BRUSH SCRUB SURG 4.25 DISP (MISCELLANEOUS) ×4 IMPLANT
CHLORAPREP W/TINT 26ML (MISCELLANEOUS) ×4 IMPLANT
COVER MAYO STAND STRL (DRAPES) IMPLANT
COVER PERINEAL POST (MISCELLANEOUS) ×4 IMPLANT
COVER SURGICAL LIGHT HANDLE (MISCELLANEOUS) ×4 IMPLANT
DRAPE C-ARM 42X72 X-RAY (DRAPES) ×4 IMPLANT
DRAPE C-ARMOR (DRAPES) ×4 IMPLANT
DRAPE HALF SHEET 40X57 (DRAPES) ×8 IMPLANT
DRAPE IMP U-DRAPE 54X76 (DRAPES) ×12 IMPLANT
DRAPE INCISE IOBAN 66X45 STRL (DRAPES) ×4 IMPLANT
DRAPE ORTHO SPLIT 77X108 STRL (DRAPES) ×6
DRAPE SURG 17X23 STRL (DRAPES) IMPLANT
DRAPE SURG ORHT 6 SPLT 77X108 (DRAPES) ×6 IMPLANT
DRAPE U-SHAPE 47X51 STRL (DRAPES) ×4 IMPLANT
DRSG ADAPTIC 3X8 NADH LF (GAUZE/BANDAGES/DRESSINGS) IMPLANT
DRSG MEPILEX BORDER 4X12 (GAUZE/BANDAGES/DRESSINGS) ×4 IMPLANT
DRSG MEPILEX BORDER 4X4 (GAUZE/BANDAGES/DRESSINGS) IMPLANT
DRSG MEPILEX BORDER 4X8 (GAUZE/BANDAGES/DRESSINGS) IMPLANT
ELECT REM PT RETURN 9FT ADLT (ELECTROSURGICAL) ×4
ELECTRODE REM PT RTRN 9FT ADLT (ELECTROSURGICAL) ×2 IMPLANT
GAUZE SPONGE 4X4 12PLY STRL (GAUZE/BANDAGES/DRESSINGS) IMPLANT
GLOVE BIO SURGEON STRL SZ7.5 (GLOVE) ×16 IMPLANT
GLOVE BIOGEL PI IND STRL 7.5 (GLOVE) ×2 IMPLANT
GLOVE BIOGEL PI INDICATOR 7.5 (GLOVE) ×2
GLOVE PROGUARD SZ 7 1/2 (GLOVE) ×4 IMPLANT
GOWN STRL REUS W/ TWL LRG LVL3 (GOWN DISPOSABLE) ×4 IMPLANT
GOWN STRL REUS W/TWL LRG LVL3 (GOWN DISPOSABLE) ×4
GUIDEWIRE 3.2X400 (WIRE) ×8 IMPLANT
GUIDEWIRE THREADED 2.8 (WIRE) ×4 IMPLANT
KIT BASIN OR (CUSTOM PROCEDURE TRAY) ×4 IMPLANT
KIT ROOM TURNOVER OR (KITS) ×4 IMPLANT
LINER BOOT UNIVERSAL DISP (MISCELLANEOUS) ×4 IMPLANT
MANIFOLD NEPTUNE II (INSTRUMENTS) ×4 IMPLANT
NAIL TROCH FIX 10X170 130 (Nail) ×4 IMPLANT
NEEDLE MAYO TROCAR (NEEDLE) ×4 IMPLANT
NS IRRIG 1000ML POUR BTL (IV SOLUTION) ×4 IMPLANT
PACK GENERAL/GYN (CUSTOM PROCEDURE TRAY) ×4 IMPLANT
PACK TOTAL JOINT (CUSTOM PROCEDURE TRAY) IMPLANT
PAD ARMBOARD 7.5X6 YLW CONV (MISCELLANEOUS) ×8 IMPLANT
PAD CAST 4YDX4 CTTN HI CHSV (CAST SUPPLIES) ×10 IMPLANT
PADDING CAST COTTON 4X4 STRL (CAST SUPPLIES) ×10
PASSER SUT SWANSON 36MM LOOP (INSTRUMENTS) ×4 IMPLANT
RETRIEVER SUT HEWSON (MISCELLANEOUS) ×4 IMPLANT
SCREW CANN TI 32 THRD 6.5X100 (Screw) ×4 IMPLANT
SCREW CANN TI 32 THRD 6.5X90 (Screw) ×4 IMPLANT
SCREW LOCKING 5.0MMX40MM (Screw) ×4 IMPLANT
SPLINT PLASTER CAST XFAST 4X15 (CAST SUPPLIES) ×2 IMPLANT
SPLINT PLASTER XTRA FAST SET 4 (CAST SUPPLIES) ×2
SPONGE LAP 18X18 X RAY DECT (DISPOSABLE) IMPLANT
STAPLER VISISTAT 35W (STAPLE) IMPLANT
STOCKINETTE IMPERVIOUS LG (DRAPES) IMPLANT
SUCTION FRAZIER HANDLE 10FR (MISCELLANEOUS) ×2
SUCTION TUBE FRAZIER 10FR DISP (MISCELLANEOUS) ×2 IMPLANT
SUT ETHILON 2 0 PSLX (SUTURE) ×8 IMPLANT
SUT ETHILON 3 0 PS 1 (SUTURE) ×4 IMPLANT
SUT MNCRL AB 3-0 PS2 18 (SUTURE) IMPLANT
SUT PROLENE 0 CT (SUTURE) IMPLANT
SUT VIC AB 0 CT1 27 (SUTURE) ×4
SUT VIC AB 0 CT1 27XBRD ANBCTR (SUTURE) ×4 IMPLANT
SUT VIC AB 1 CT1 27 (SUTURE)
SUT VIC AB 1 CT1 27XBRD ANBCTR (SUTURE) IMPLANT
SUT VIC AB 2-0 CT1 27 (SUTURE) ×8
SUT VIC AB 2-0 CT1 TAPERPNT 27 (SUTURE) ×8 IMPLANT
TUBE CONNECTING 12'X1/4 (SUCTIONS) ×1
TUBE CONNECTING 12X1/4 (SUCTIONS) ×3 IMPLANT
WASHER 13.0MM (Orthopedic Implant) ×8 IMPLANT
WATER STERILE IRR 1000ML POUR (IV SOLUTION) IMPLANT
YANKAUER SUCT BULB TIP NO VENT (SUCTIONS) ×4 IMPLANT
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
## 2016-12-11 NOTE — Transfer of Care (Signed)
Immediate Anesthesia Transfer of Care Note  Patient: Stephen Rivera  Procedure(s) Performed: INTRAMEDULLARY (IM) NAIL INTERTROCHANTRIC  (Right Hip)  Patient Location: PACU  Anesthesia Type:General  Level of Consciousness: responds to stimulation  Airway & Oxygen Therapy: Patient Spontanous Breathing and Patient connected to nasal cannula oxygen  Post-op Assessment: Report given to RN and Post -op Vital signs reviewed and stable  Post vital signs: Reviewed and stable  Last Vitals:  Vitals:   12/11/16 1154 12/11/16 1555  BP:  140/76  Pulse:  97  Resp:  20  Temp: 37.2 C 37.8 C  SpO2:  97%    Last Pain:  Vitals:   12/11/16 1555  TempSrc: Oral  PainSc:          Complications: No apparent anesthesia complications

## 2016-12-11 NOTE — Anesthesia Postprocedure Evaluation (Signed)
Anesthesia Post Note  Patient: Damita Dunningsed M Langham  Procedure(s) Performed: INTRAMEDULLARY (IM) NAIL INTERTROCHANTRIC  (Right Hip)     Patient location during evaluation: PACU Anesthesia Type: General Level of consciousness: awake and alert Pain management: pain level controlled Vital Signs Assessment: post-procedure vital signs reviewed and stable Respiratory status: spontaneous breathing, nonlabored ventilation, respiratory function stable and patient connected to nasal cannula oxygen Cardiovascular status: blood pressure returned to baseline and stable Postop Assessment: no apparent nausea or vomiting Anesthetic complications: no    Last Vitals:  Vitals:   12/11/16 2122 12/11/16 2152  BP: (!) 153/88 (!) 148/92  Pulse: (!) 106 92  Resp: (!) 22 11  Temp: (!) 36.3 C   SpO2: 94% 94%    Last Pain:  Vitals:   12/11/16 2212  TempSrc:   PainSc: 6                  Sherrita Riederer DAVID
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
## 2016-12-11 NOTE — Progress Notes (Signed)
Spoke to [employer] & McLenn[employer]Kelly Rayburn, [place] this AM regarding chest x-ray results and radiologist recommending chest CT for possible Lt pneumo. Also spoke to her about adding rectal tylenol for fever ( 100.33F ). Pt on the way to CT now, will re-check temperature upon return for tylenol need.

## 2016-12-11 NOTE — Plan of Care (Signed)
  Progressing Education: Knowledge of General Education information will improve 12/11/2016 1048 - Progressing by Quentin CornwallMadison, Amoni Scallan, RN Health Behavior/Discharge Planning: Ability to manage health-related needs will improve 12/11/2016 1048 - Progressing by Quentin CornwallMadison, Tyiana Hill, RN Clinical Measurements: Ability to maintain clinical measurements within normal limits will improve 12/11/2016 1048 - Progressing by Quentin CornwallMadison, Aliany Fiorenza, RN Will remain free from infection 12/11/2016 1048 - Progressing by Quentin CornwallMadison, Willim Turnage, RN Diagnostic test results will improve 12/11/2016 1048 - Progressing by Quentin CornwallMadison, Arihanna Estabrook, RN Respiratory complications will improve 12/11/2016 1048 - Progressing by Quentin CornwallMadison, Daryn Pisani, RN Cardiovascular complication will be avoided 12/11/2016 1048 - Progressing by Quentin CornwallMadison, Valori Hollenkamp, RN Activity: Risk for activity intolerance will decrease 12/11/2016 1048 - Progressing by Quentin CornwallMadison, Yossi Hinchman, RN Nutrition: Adequate nutrition will be maintained 12/11/2016 1048 - Progressing by Quentin CornwallMadison, Aaron Bostwick, RN Coping: Level of anxiety will decrease 12/11/2016 1048 - Progressing by Quentin CornwallMadison, Ilario Dhaliwal, RN Elimination: Will not experience complications related to bowel motility 12/11/2016 1048 - Progressing by Quentin CornwallMadison, Oliviya Gilkison, RN Will not experience complications related to urinary retention 12/11/2016 1048 - Progressing by Quentin CornwallMadison, Mahonri Seiden, RN Pain Managment: General experience of comfort will improve 12/11/2016 1048 - Progressing by Quentin CornwallMadison, Jariya Reichow, RN Safety: Ability to remain free from injury will improve 12/11/2016 1048 - Progressing by Quentin CornwallMadison, Lovell Roe, RN Skin Integrity: Risk for impaired skin integrity will decrease 12/11/2016 1048 - Progressing by Quentin CornwallMadison, Glyn Gerads, RN

## 2016-12-11 NOTE — Plan of Care (Signed)
  Safety: Ability to remain free from injury will improve 12/11/2016 0557 - Progressing by Agnes LawrenceLackey, Toryn Dewalt, RN

## 2016-12-11 NOTE — Anesthesia Procedure Notes (Signed)
Procedure Name: Intubation Date/Time: 12/11/2016 5:56 PM Performed by: Bryson Corona, CRNA Pre-anesthesia Checklist: Patient identified, Emergency Drugs available, Suction available and Patient being monitored Patient Re-evaluated:Patient Re-evaluated prior to induction Oxygen Delivery Method: Circle System Utilized Preoxygenation: Pre-oxygenation with 100% oxygen Induction Type: IV induction Ventilation: Mask ventilation without difficulty Laryngoscope Size: Mac and 3 Grade View: Grade I Tube type: Oral Tube size: 7.0 mm Number of attempts: 1 Airway Equipment and Method: Stylet and Oral airway Placement Confirmation: ETT inserted through vocal cords under direct vision,  positive ETCO2 and breath sounds checked- equal and bilateral Secured at: 22 cm Tube secured with: Tape Dental Injury: Teeth and Oropharynx as per pre-operative assessment
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
## 2016-12-12 ENCOUNTER — Inpatient Hospital Stay (HOSPITAL_COMMUNITY): Payer: Medicare Other

## 2016-12-12 ENCOUNTER — Encounter (HOSPITAL_COMMUNITY): Payer: Self-pay | Admitting: Student

## 2016-12-12 LAB — BASIC METABOLIC PANEL
Anion gap: 11 (ref 5–15)
BUN: 12 mg/dL (ref 6–20)
CALCIUM: 8 mg/dL — AB (ref 8.9–10.3)
CHLORIDE: 97 mmol/L — AB (ref 101–111)
CO2: 26 mmol/L (ref 22–32)
CREATININE: 1.02 mg/dL (ref 0.61–1.24)
GFR calc non Af Amer: >60 mL/min (ref >60)
GLUCOSE: 177 mg/dL — AB (ref 65–99)
Potassium: 4 mmol/L (ref 3.5–5.1)
Sodium: 134 mmol/L — ABNORMAL LOW (ref 135–145)

## 2016-12-12 LAB — CBC
HEMATOCRIT: 27 % — AB (ref 39.0–52.0)
Hemoglobin: 8.6 g/dL — ABNORMAL LOW (ref 13.0–17.0)
MCH: 28.8 pg (ref 26.0–34.0)
MCHC: 31.9 g/dL (ref 30.0–36.0)
MCV: 90.3 fL (ref 78.0–100.0)
Platelets: 120 10*3/uL — ABNORMAL LOW (ref 150–400)
RBC: 2.99 MIL/uL — ABNORMAL LOW (ref 4.22–5.81)
RDW: 13.9 % (ref 11.5–15.5)
WBC: 8.1 10*3/uL (ref 4.0–10.5)

## 2016-12-12 LAB — URINALYSIS, ROUTINE W REFLEX MICROSCOPIC
BACTERIA UA: NONE SEEN
BILIRUBIN URINE: NEGATIVE
Glucose, UA: 50 mg/dL — AB
KETONES UR: NEGATIVE mg/dL
LEUKOCYTES UA: NEGATIVE
NITRITE: NEGATIVE
Protein, ur: NEGATIVE mg/dL
SPECIFIC GRAVITY, URINE: 1.019 (ref 1.005–1.030)
Squamous Epithelial / LPF: NONE SEEN
pH: 6 (ref 5.0–8.0)

## 2016-12-12 LAB — RAPID URINE DRUG SCREEN, HOSP PERFORMED
Amphetamines: NOT DETECTED
Barbiturates: NOT DETECTED
Benzodiazepines: NOT DETECTED
Cocaine: POSITIVE — AB
OPIATES: NOT DETECTED
TETRAHYDROCANNABINOL: NOT DETECTED

## 2016-12-12 LAB — POCT I-STAT 4, (NA,K, GLUC, HGB,HCT)
Glucose, Bld: 167 mg/dL — ABNORMAL HIGH (ref 65–99)
HEMATOCRIT: 26 % — AB (ref 39.0–52.0)
HEMOGLOBIN: 8.8 g/dL — AB (ref 13.0–17.0)
POTASSIUM: 4.3 mmol/L (ref 3.5–5.1)
Sodium: 137 mmol/L (ref 135–145)

## 2016-12-12 MED ORDER — OXYCODONE HCL 5 MG PO TABS
5.0000 mg | ORAL_TABLET | ORAL | Status: DC | PRN
Start: 2016-12-12 — End: 2016-12-19
  Administered 2016-12-12 – 2016-12-17 (×10): 5 mg via ORAL
  Filled 2016-12-12 (×10): qty 1

## 2016-12-12 MED ORDER — KCL IN DEXTROSE-NACL 20-5-0.45 MEQ/L-%-% IV SOLN
INTRAVENOUS | Status: DC
  Administered 2016-12-12 – 2016-12-16 (×6): via INTRAVENOUS
  Administered 2016-12-17: 75 mL/h via INTRAVENOUS
  Administered 2016-12-19: 07:00:00 via INTRAVENOUS
  Filled 2016-12-12 (×9): qty 1000

## 2016-12-12 MED ORDER — CEFAZOLIN SODIUM-DEXTROSE 2-4 GM/100ML-% IV SOLN
2.0000 g | Freq: Three times a day (TID) | INTRAVENOUS | Status: AC
  Administered 2016-12-12 (×3): 2 g via INTRAVENOUS
  Filled 2016-12-12 (×3): qty 100

## 2016-12-12 MED ORDER — GUAIFENESIN ER 600 MG PO TB12
600.0000 mg | ORAL_TABLET | Freq: Two times a day (BID) | ORAL | Status: AC
  Administered 2016-12-12 – 2016-12-16 (×9): 600 mg via ORAL
  Filled 2016-12-12 (×9): qty 1

## 2016-12-12 NOTE — Progress Notes (Signed)
RN spoke to Dr. Lovette ClicheHaddix,about physical therapy for patient. MD gave Verbal order to put in PT consult.
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
## 2016-12-13 ENCOUNTER — Inpatient Hospital Stay (HOSPITAL_COMMUNITY): Payer: Medicare Other | Admitting: Anesthesiology

## 2016-12-13 ENCOUNTER — Encounter (HOSPITAL_COMMUNITY): Admission: EM | Disposition: A | Discharge status: Skilled Nursing Facility | Payer: Self-pay | Source: Home / Self Care

## 2016-12-13 ENCOUNTER — Inpatient Hospital Stay (HOSPITAL_COMMUNITY): Payer: Medicare Other

## 2016-12-13 HISTORY — PX: ORIF TIBIA FRACTURE: SHX5416

## 2016-12-13 LAB — CBC
HEMATOCRIT: 25.6 % — AB (ref 39.0–52.0)
HEMOGLOBIN: 8.2 g/dL — AB (ref 13.0–17.0)
MCH: 29.1 pg (ref 26.0–34.0)
MCHC: 32 g/dL (ref 30.0–36.0)
MCV: 90.8 fL (ref 78.0–100.0)
Platelets: 137 10*3/uL — ABNORMAL LOW (ref 150–400)
RBC: 2.82 MIL/uL — ABNORMAL LOW (ref 4.22–5.81)
RDW: 13.7 % (ref 11.5–15.5)
WBC: 7.1 10*3/uL (ref 4.0–10.5)

## 2016-12-13 SURGERY — OPEN REDUCTION INTERNAL FIXATION (ORIF) TIBIA FRACTURE
Anesthesia: General | Laterality: Left

## 2016-12-13 MED ORDER — LACTATED RINGERS IV SOLN
INTRAVENOUS | Status: DC | PRN
  Administered 2016-12-13 (×2): via INTRAVENOUS

## 2016-12-13 MED ORDER — ALBUTEROL SULFATE HFA 108 (90 BASE) MCG/ACT IN AERS
INHALATION_SPRAY | RESPIRATORY_TRACT | Status: DC | PRN
  Administered 2016-12-13: 4 via RESPIRATORY_TRACT

## 2016-12-13 MED ORDER — DEXAMETHASONE SODIUM PHOSPHATE 10 MG/ML IJ SOLN
INTRAMUSCULAR | Status: DC | PRN
  Administered 2016-12-13: 8 mg via INTRAVENOUS

## 2016-12-13 MED ORDER — OXYCODONE HCL 5 MG PO TABS: 5.0000 mg | ORAL_TABLET | Freq: Once | ORAL | Status: DC | PRN

## 2016-12-13 MED ORDER — OXYCODONE HCL 5 MG/5ML PO SOLN: 5.0000 mg | Freq: Once | ORAL | Status: DC | PRN

## 2016-12-13 MED ORDER — HYDROMORPHONE HCL 1 MG/ML IJ SOLN
INTRAMUSCULAR | Status: AC
  Filled 2016-12-13: qty 1

## 2016-12-13 MED ORDER — ROCURONIUM BROMIDE 10 MG/ML (PF) SYRINGE
PREFILLED_SYRINGE | INTRAVENOUS | Status: AC
  Filled 2016-12-13: qty 5

## 2016-12-13 MED ORDER — SUGAMMADEX SODIUM 200 MG/2ML IV SOLN
INTRAVENOUS | Status: AC
Start: 2016-12-13 — End: 2016-12-13
  Filled 2016-12-13: qty 2

## 2016-12-13 MED ORDER — CEFAZOLIN SODIUM-DEXTROSE 2-4 GM/100ML-% IV SOLN
2.0000 g | INTRAVENOUS | Status: AC
  Administered 2016-12-13: 2 g via INTRAVENOUS
  Filled 2016-12-13: qty 100

## 2016-12-13 MED ORDER — PROMETHAZINE HCL 25 MG/ML IJ SOLN: 6.2500 mg | INTRAMUSCULAR | Status: DC | PRN

## 2016-12-13 MED ORDER — LIDOCAINE 2% (20 MG/ML) 5 ML SYRINGE
INTRAMUSCULAR | Status: AC
Start: 2016-12-13 — End: 2016-12-13
  Filled 2016-12-13: qty 5

## 2016-12-13 MED ORDER — DEXAMETHASONE SODIUM PHOSPHATE 10 MG/ML IJ SOLN
INTRAMUSCULAR | Status: AC
  Filled 2016-12-13: qty 1

## 2016-12-13 MED ORDER — SUGAMMADEX SODIUM 500 MG/5ML IV SOLN
INTRAVENOUS | Status: DC | PRN
  Administered 2016-12-13: 300 mg via INTRAVENOUS

## 2016-12-13 MED ORDER — ALBUMIN HUMAN 5 % IV SOLN
INTRAVENOUS | Status: DC | PRN
  Administered 2016-12-13: 09:00:00 via INTRAVENOUS

## 2016-12-13 MED ORDER — MIDAZOLAM HCL 2 MG/2ML IJ SOLN
INTRAMUSCULAR | Status: AC
Start: 2016-12-13 — End: 2016-12-13
  Filled 2016-12-13: qty 2

## 2016-12-13 MED ORDER — ROCURONIUM BROMIDE 100 MG/10ML IV SOLN
INTRAVENOUS | Status: DC | PRN
  Administered 2016-12-13 (×2): 10 mg via INTRAVENOUS
  Administered 2016-12-13: 40 mg via INTRAVENOUS
  Administered 2016-12-13 (×2): 10 mg via INTRAVENOUS

## 2016-12-13 MED ORDER — HYDROMORPHONE HCL 1 MG/ML IJ SOLN
0.2500 mg | INTRAMUSCULAR | Status: DC | PRN
Start: 2016-12-13 — End: 2016-12-13
  Administered 2016-12-13 (×2): 0.5 mg via INTRAVENOUS

## 2016-12-13 MED ORDER — PROPOFOL 10 MG/ML IV BOLUS
INTRAVENOUS | Status: DC | PRN
  Administered 2016-12-13: 150 mg via INTRAVENOUS

## 2016-12-13 MED ORDER — FENTANYL CITRATE (PF) 250 MCG/5ML IJ SOLN
INTRAMUSCULAR | Status: AC
  Filled 2016-12-13: qty 5

## 2016-12-13 MED ORDER — LIDOCAINE HCL (CARDIAC) 20 MG/ML IV SOLN
INTRAVENOUS | Status: DC | PRN
  Administered 2016-12-13: 60 mg via INTRAVENOUS

## 2016-12-13 MED ORDER — BACITRACIN ZINC 500 UNIT/GM EX OINT
TOPICAL_OINTMENT | CUTANEOUS | Status: AC
  Filled 2016-12-13: qty 28.35

## 2016-12-13 MED ORDER — ONDANSETRON HCL 4 MG/2ML IJ SOLN
INTRAMUSCULAR | Status: DC | PRN
  Administered 2016-12-13: 4 mg via INTRAVENOUS

## 2016-12-13 MED ORDER — PHENYLEPHRINE HCL 10 MG/ML IJ SOLN
INTRAMUSCULAR | Status: DC | PRN
  Administered 2016-12-13: 40 ug via INTRAVENOUS
  Administered 2016-12-13 (×3): 80 ug via INTRAVENOUS

## 2016-12-13 MED ORDER — FENTANYL CITRATE (PF) 100 MCG/2ML IJ SOLN
INTRAMUSCULAR | Status: DC | PRN
  Administered 2016-12-13: 50 ug via INTRAVENOUS
  Administered 2016-12-13: 100 ug via INTRAVENOUS
  Administered 2016-12-13 (×2): 50 ug via INTRAVENOUS

## 2016-12-13 MED ORDER — ONDANSETRON HCL 4 MG/2ML IJ SOLN
INTRAMUSCULAR | Status: AC
  Filled 2016-12-13: qty 2

## 2016-12-13 MED ORDER — VANCOMYCIN HCL 1000 MG IV SOLR
INTRAVENOUS | Status: DC | PRN
  Administered 2016-12-13: 1000 mg via TOPICAL

## 2016-12-13 MED ORDER — VANCOMYCIN HCL 1000 MG IV SOLR
INTRAVENOUS | Status: AC
  Filled 2016-12-13: qty 1000

## 2016-12-13 MED ORDER — PROPOFOL 10 MG/ML IV BOLUS
INTRAVENOUS | Status: AC
  Filled 2016-12-13: qty 20

## 2016-12-13 MED ORDER — 0.9 % SODIUM CHLORIDE (POUR BTL) OPTIME
TOPICAL | Status: DC | PRN
  Administered 2016-12-13: 1000 mL

## 2016-12-13 MED FILL — Vancomycin HCl For IV Soln 1 GM (Base Equivalent): INTRAVENOUS | Qty: 1000 | Status: AC

## 2016-12-13 SURGICAL SUPPLY — 84 items
BANDAGE ACE 4X5 VEL STRL LF (GAUZE/BANDAGES/DRESSINGS) ×3 IMPLANT
BANDAGE ACE 6X5 VEL STRL LF (GAUZE/BANDAGES/DRESSINGS) ×3 IMPLANT
BANDAGE ELASTIC 4 VELCRO ST LF (GAUZE/BANDAGES/DRESSINGS) ×3 IMPLANT
BANDAGE ELASTIC 6 VELCRO ST LF (GAUZE/BANDAGES/DRESSINGS) ×3 IMPLANT
BANDAGE ESMARK 6X9 LF (GAUZE/BANDAGES/DRESSINGS) ×1 IMPLANT
BIT DRILL 2.5 X LONG (BIT) ×1
BIT DRILL CALIBR QC 2.8X250 (BIT) ×3 IMPLANT
BIT DRILL X LONG 2.5 (BIT) ×1 IMPLANT
BLADE CLIPPER SURG (BLADE) IMPLANT
BNDG COHESIVE 4X5 TAN STRL (GAUZE/BANDAGES/DRESSINGS) IMPLANT
BNDG ESMARK 6X9 LF (GAUZE/BANDAGES/DRESSINGS) ×3
BNDG GAUZE ELAST 4 BULKY (GAUZE/BANDAGES/DRESSINGS) ×3 IMPLANT
BRUSH SCRUB SURG 4.25 DISP (MISCELLANEOUS) ×6 IMPLANT
CHLORAPREP W/TINT 26ML (MISCELLANEOUS) ×3 IMPLANT
COVER MAYO STAND STRL (DRAPES) ×3 IMPLANT
COVER SURGICAL LIGHT HANDLE (MISCELLANEOUS) ×3 IMPLANT
DRAPE C-ARM 42X72 X-RAY (DRAPES) ×3 IMPLANT
DRAPE C-ARMOR (DRAPES) ×3 IMPLANT
DRAPE EXTREMITY T 121X128X90 (DRAPE) ×3 IMPLANT
DRAPE HALF SHEET 40X57 (DRAPES) ×6 IMPLANT
DRAPE INCISE IOBAN 66X45 STRL (DRAPES) ×3 IMPLANT
DRAPE U-SHAPE 47X51 STRL (DRAPES) ×3 IMPLANT
DRILL BIT X LONG 2.5 (BIT) ×2
DRSG ADAPTIC 3X8 NADH LF (GAUZE/BANDAGES/DRESSINGS) ×3 IMPLANT
DRSG PAD ABDOMINAL 8X10 ST (GAUZE/BANDAGES/DRESSINGS) ×12 IMPLANT
ELECT REM PT RETURN 9FT ADLT (ELECTROSURGICAL) ×3
ELECTRODE REM PT RTRN 9FT ADLT (ELECTROSURGICAL) ×1 IMPLANT
GAUZE SPONGE 4X4 12PLY STRL (GAUZE/BANDAGES/DRESSINGS) ×3 IMPLANT
GAUZE SPONGE 4X4 12PLY STRL LF (GAUZE/BANDAGES/DRESSINGS) ×3 IMPLANT
GLOVE BIO SURGEON STRL SZ7.5 (GLOVE) ×12 IMPLANT
GLOVE BIOGEL PI IND STRL 7.5 (GLOVE) ×1 IMPLANT
GLOVE BIOGEL PI INDICATOR 7.5 (GLOVE) ×2
GLOVE PROGUARD SZ 7 1/2 (GLOVE) ×3 IMPLANT
GOWN STRL REUS W/ TWL LRG LVL3 (GOWN DISPOSABLE) ×2 IMPLANT
GOWN STRL REUS W/TWL LRG LVL3 (GOWN DISPOSABLE) ×4
K-WIRE 1.25 TRCR POINT 150 (WIRE) ×3
KIT BASIN OR (CUSTOM PROCEDURE TRAY) ×3 IMPLANT
KIT ROOM TURNOVER OR (KITS) ×3 IMPLANT
KWIRE 1.25 TRCR POINT 150 (WIRE) ×1 IMPLANT
MANIFOLD NEPTUNE II (INSTRUMENTS) ×3 IMPLANT
NS IRRIG 1000ML POUR BTL (IV SOLUTION) ×3 IMPLANT
PACK TOTAL JOINT (CUSTOM PROCEDURE TRAY) ×3 IMPLANT
PAD ABD 8X10 STRL (GAUZE/BANDAGES/DRESSINGS) ×6 IMPLANT
PAD ARMBOARD 7.5X6 YLW CONV (MISCELLANEOUS) ×6 IMPLANT
PAD CAST 4YDX4 CTTN HI CHSV (CAST SUPPLIES) ×1 IMPLANT
PADDING CAST COTTON 4X4 STRL (CAST SUPPLIES) ×2
PADDING CAST COTTON 6X4 STRL (CAST SUPPLIES) ×3 IMPLANT
PLATE TIBIA PROX 14H 3.5VA LT (Plate) ×3 IMPLANT
SCREW CORTEX 3.5 30MM (Screw) ×6 IMPLANT
SCREW CORTEX 3.5 34MM (Screw) ×2 IMPLANT
SCREW CORTEX 3.5 36MM (Screw) ×2 IMPLANT
SCREW CORTEX 3.5 38MM (Screw) ×2 IMPLANT
SCREW CORTEX 3.5 45MM (Screw) ×3 IMPLANT
SCREW LOCK CORT ST 3.5X30 (Screw) ×3 IMPLANT
SCREW LOCK CORT ST 3.5X34 (Screw) ×1 IMPLANT
SCREW LOCK CORT ST 3.5X36 (Screw) ×1 IMPLANT
SCREW LOCK CORT ST 3.5X38 (Screw) ×1 IMPLANT
SCREW LOCKING 3.5X80MM VA (Screw) ×3 IMPLANT
SCREW LOCKING VA 3.5X34MM (Screw) ×3 IMPLANT
SCREW LOCKING VA 3.5X75MM (Screw) ×6 IMPLANT
SCREW VA-LOCKING 65MM 3.5 (Screw) ×3 IMPLANT
SPONGE LAP 18X18 X RAY DECT (DISPOSABLE) IMPLANT
STAPLER VISISTAT 35W (STAPLE) ×3 IMPLANT
STOCKINETTE IMPERVIOUS LG (DRAPES) IMPLANT
SUCTION FRAZIER HANDLE 10FR (MISCELLANEOUS) ×2
SUCTION TUBE FRAZIER 10FR DISP (MISCELLANEOUS) ×1 IMPLANT
SUT ETHILON 3 0 PS 1 (SUTURE) ×3 IMPLANT
SUT MNCRL AB 3-0 PS2 18 (SUTURE) ×3 IMPLANT
SUT PROLENE 0 CT (SUTURE) IMPLANT
SUT VIC AB 0 CT1 18XCR BRD 8 (SUTURE) ×1 IMPLANT
SUT VIC AB 0 CT1 27 (SUTURE) ×2
SUT VIC AB 0 CT1 27XBRD ANBCTR (SUTURE) ×1 IMPLANT
SUT VIC AB 0 CT1 8-18 (SUTURE) ×2
SUT VIC AB 1 CT1 27 (SUTURE) ×2
SUT VIC AB 1 CT1 27XBRD ANBCTR (SUTURE) ×1 IMPLANT
SUT VIC AB 2-0 CT1 27 (SUTURE) ×4
SUT VIC AB 2-0 CT1 TAPERPNT 27 (SUTURE) ×2 IMPLANT
TOWEL OR 17X24 6PK STRL BLUE (TOWEL DISPOSABLE) ×3 IMPLANT
TOWEL OR 17X26 10 PK STRL BLUE (TOWEL DISPOSABLE) ×6 IMPLANT
TRAY FOLEY W/METER SILVER 16FR (SET/KITS/TRAYS/PACK) IMPLANT
TUBE CONNECTING 12'X1/4 (SUCTIONS) ×1
TUBE CONNECTING 12X1/4 (SUCTIONS) ×2 IMPLANT
WATER STERILE IRR 1000ML POUR (IV SOLUTION) ×6 IMPLANT
YANKAUER SUCT BULB TIP NO VENT (SUCTIONS) ×3 IMPLANT
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
## 2016-12-13 NOTE — Anesthesia Preprocedure Evaluation (Addendum)
Anesthesia Evaluation  Patient identified by MRN, date of birth, ID band Patient awake    Reviewed: Allergy & Precautions, NPO status , Patient's Chart, lab work & pertinent test results  Airway Mallampati: II  TM Distance: >3 FB Neck ROM: Limited   Comment: C-collar Dental  (+) Edentulous Upper, Edentulous Lower   Pulmonary Current Smoker,    Pulmonary exam normal breath sounds clear to auscultation       Cardiovascular negative cardio ROS Normal cardiovascular exam Rhythm:Regular Rate:Normal  ECG: SR, rate 80   Neuro/Psych negative psych ROS   GI/Hepatic negative GI ROS, (+)     substance abuse  ,   Endo/Other  negative endocrine ROS  Renal/GU negative Renal ROS     Musculoskeletal  (+) Arthritis , Osteoarthritis,  Right intertrochanteric femur Left tibial shaft fracture   Abdominal   Peds  Hematology  (+) anemia , Thrombocytopenia    Anesthesia Other Findings Day of surgery medications reviewed with the patient.  Reproductive/Obstetrics                            Anesthesia Physical  Anesthesia Plan  ASA: III  Anesthesia Plan: General   Post-op Pain Management:    Induction: Intravenous  PONV Risk Score and Plan: 3 and Midazolam, Dexamethasone and Ondansetron  Airway Management Planned: Oral ETT  Additional Equipment:   Intra-op Plan:   Post-operative Plan: Extubation in OR  Informed Consent: I have reviewed the patients History and Physical, chart, labs and discussed the procedure including the risks, benefits and alternatives for the proposed anesthesia with the patient or authorized representative who has indicated his/her understanding and acceptance.   Dental advisory given  Plan Discussed with: CRNA  Anesthesia Plan Comments:        Anesthesia Quick Evaluation

## 2016-12-13 NOTE — Anesthesia Procedure Notes (Signed)
Procedure Name: Intubation Date/Time: 12/13/2016 8:41 AM Performed by: Murvin Natal, MD Pre-anesthesia Checklist: Patient identified, Emergency Drugs available, Suction available, Patient being monitored and Timeout performed Patient Re-evaluated:Patient Re-evaluated prior to induction Oxygen Delivery Method: Circle system utilized Preoxygenation: Pre-oxygenation with 100% oxygen Induction Type: IV induction and Cricoid Pressure applied Ventilation: Mask ventilation without difficulty Laryngoscope Size: Mac and 4 Grade View: Grade I Tube type: Oral Tube size: 7.5 mm Number of attempts: 1 Placement Confirmation: ETT inserted through vocal cords under direct vision,  positive ETCO2 and breath sounds checked- equal and bilateral Secured at: 21 cm Tube secured with: Tape Dental Injury: Teeth and Oropharynx as per pre-operative assessment

## 2016-12-13 NOTE — Progress Notes (Signed)
PT Cancellation Note  Patient Details Name: Stephen Rivera MRN: [id number] DOB: 02/14/1950   Cancelled Treatment:    Reason Eval/Treat Not Completed: Patient at procedure or test/unavailable. Pt currently in OR. Will continue to follow and initiate PT eval when able.    Marylynn PearsonLaura D Agnes Probert 12/13/2016, 10:02 AM  Conni SlipperLaura Miken Stecher, PT, DPT Acute Rehabilitation Services Pager: [phone]

## 2016-12-13 NOTE — Anesthesia Postprocedure Evaluation (Signed)
Anesthesia Post Note  Patient: Damita Dunningsed M Hypolite  Procedure(s) Performed: OPEN REDUCTION INTERNAL FIXATION (ORIF) TIBIA FRACTURE (Left )     Patient location during evaluation: PACU Anesthesia Type: General Level of consciousness: awake and alert Pain management: pain level controlled Vital Signs Assessment: post-procedure vital signs reviewed and stable Respiratory status: spontaneous breathing, nonlabored ventilation, respiratory function stable and patient connected to nasal cannula oxygen Cardiovascular status: blood pressure returned to baseline and stable Postop Assessment: no apparent nausea or vomiting Anesthetic complications: no    Last Vitals:  Vitals:   12/13/16 1145 12/13/16 1430  BP:  126/64  Pulse:  85  Resp:    Temp: 36.5 C 37.1 C  SpO2:  99%    Last Pain:  Vitals:   12/13/16 1430  TempSrc: Oral  PainSc:                  Ryan P Ellender

## 2016-12-13 NOTE — Transfer of Care (Signed)
Immediate Anesthesia Transfer of Care Note  Patient: Stephen Rivera  Procedure(s) Performed: OPEN REDUCTION INTERNAL FIXATION (ORIF) TIBIA FRACTURE (Left )  Patient Location: PACU  Anesthesia Type:General  Level of Consciousness: awake, oriented, drowsy, patient cooperative, lethargic and responds to stimulation  Airway & Oxygen Therapy: Patient Spontanous Breathing and Patient connected to face mask oxygen  Post-op Assessment: Report given to RN, Post -op Vital signs reviewed and stable and Patient moving all extremities  Post vital signs: Reviewed  Last Vitals:  Vitals:   12/13/16 0437 12/13/16 0640  BP: 136/81 114/65  Pulse: 91 76  Resp: 18 18  Temp: 36.8 C 37.1 C  SpO2: 100% 93%    Last Pain:  Vitals:   12/13/16 0640  TempSrc: Oral  PainSc:       Patients Stated Pain Goal: 2 (12/11/16 2237)  Complications: No apparent anesthesia complications
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
## 2016-12-14 ENCOUNTER — Encounter (HOSPITAL_COMMUNITY): Payer: Self-pay | Admitting: Student

## 2016-12-14 LAB — BPAM RBC
BLOOD PRODUCT EXPIRATION DATE: 201812272359
Blood Product Expiration Date: 201812272359
UNIT TYPE AND RH: 6200
Unit Type and Rh: 6200

## 2016-12-14 LAB — TYPE AND SCREEN
ABO/RH(D): A POS
ANTIBODY SCREEN: NEGATIVE
UNIT DIVISION: 0
Unit division: 0

## 2016-12-14 LAB — CBC
HCT: 22.5 % — ABNORMAL LOW (ref 39.0–52.0)
Hemoglobin: 7.3 g/dL — ABNORMAL LOW (ref 13.0–17.0)
MCH: 29.1 pg (ref 26.0–34.0)
MCHC: 32.4 g/dL (ref 30.0–36.0)
MCV: 89.6 fL (ref 78.0–100.0)
PLATELETS: 155 10*3/uL (ref 150–400)
RBC: 2.51 MIL/uL — ABNORMAL LOW (ref 4.22–5.81)
RDW: 13.2 % (ref 11.5–15.5)
WBC: 7.9 10*3/uL (ref 4.0–10.5)

## 2016-12-14 MED ORDER — INFLUENZA VAC SPLIT HIGH-DOSE 0.5 ML IM SUSY
0.5000 mL | PREFILLED_SYRINGE | INTRAMUSCULAR | Status: AC
  Administered 2016-12-16: 0.5 mL via INTRAMUSCULAR
  Filled 2016-12-14: qty 0.5

## 2016-12-14 MED ORDER — ENSURE ENLIVE PO LIQD
237.0000 mL | Freq: Two times a day (BID) | ORAL | Status: DC
  Administered 2016-12-14 – 2016-12-19 (×10): 237 mL via ORAL
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
## 2016-12-14 NOTE — Care Management Important Message (Signed)
Important Message  Patient Details  Name: Stephen Rivera M Parham MRN: [id number] Date of Birth: 07/26/1950   Medicare Important Message Given:  Yes    Dorena BodoIris Asante Blanda 12/14/2016, 12:47 PM
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
## 2016-12-14 NOTE — Social Work (Signed)
CSW completed SBIRT with patient.  Risk factors identified, however patient declined information or treatment. Pt has been in treatment in the past and indicated he declines at this time. CSW used actively listening and motivational interviewing to encourage patient to consider some support when he discharges. Pt not amenable at this time for interventions.  Keene BreathPatricia Yao [place], LCSW Clinical Social Worker [phone]
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
## 2016-12-15 LAB — CBC
HEMATOCRIT: 24.5 % — AB (ref 39.0–52.0)
Hemoglobin: 7.8 g/dL — ABNORMAL LOW (ref 13.0–17.0)
MCH: 29.2 pg (ref 26.0–34.0)
MCHC: 31.8 g/dL (ref 30.0–36.0)
MCV: 91.8 fL (ref 78.0–100.0)
Platelets: 185 10*3/uL (ref 150–400)
RBC: 2.67 MIL/uL — ABNORMAL LOW (ref 4.22–5.81)
RDW: 13.7 % (ref 11.5–15.5)
WBC: 8.4 10*3/uL (ref 4.0–10.5)

## 2016-12-15 MED ORDER — METOPROLOL TARTRATE 12.5 MG HALF TABLET
12.5000 mg | ORAL_TABLET | Freq: Two times a day (BID) | ORAL | Status: DC
  Administered 2016-12-15 – 2016-12-19 (×9): 12.5 mg via ORAL
  Filled 2016-12-15 (×9): qty 1
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
## 2016-12-15 NOTE — Progress Notes (Signed)
PT Cancellation Note  Patient Details Name: Stephen Rivera MRN: [id number] DOB: 09/18/1950   Cancelled Treatment:     attempted to work with patient, adamantly declining citing he just got tucked back into bed from using urinal and does not wish to move at this point, requested I see him later this morning, will re-attempt.   Etta GrandchildSean Leocadio Heal, PT, DPT Acute Rehab Services Pager: [phone]    Etta GrandchildSean  Jax Abdelrahman 12/15/2016, 9:46 AM
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
## 2016-12-16 NOTE — Plan of Care (Signed)
  Clinical Measurements: Will remain free from infection 12/16/2016 1356 - Progressing by Darrow BussingArcilla, Mccade Sullenberger M, RN   Nutrition: Adequate nutrition will be maintained 12/16/2016 1356 - Progressing by Darrow BussingArcilla, Oluwateniola Leitch M, RN   Coping: Level of anxiety will decrease 12/16/2016 1356 - Progressing by Darrow BussingArcilla, Crissa Sowder M, RN   Pain Managment: General experience of comfort will improve 12/16/2016 1356 - Progressing by Darrow BussingArcilla, Merric Yost M, RN   Skin Integrity: Risk for impaired skin integrity will decrease 12/16/2016 1356 - Progressing by Darrow BussingArcilla, Azam Gervasi M, RN
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
## 2016-12-16 NOTE — Plan of Care (Signed)
  Progressing Nutrition: Adequate nutrition will be maintained 12/16/2016 0558 - Progressing by Agnes LawrenceLackey, Reonna Finlayson, RN Coping: Level of anxiety will decrease 12/16/2016 0558 - Progressing by Agnes LawrenceLackey, Stellan Vick, RN Elimination: Will not experience complications related to bowel motility 12/16/2016 0558 - Progressing by Agnes LawrenceLackey, Yailene Badia, RN Pain Managment: General experience of comfort will improve 12/16/2016 0558 - Progressing by Agnes LawrenceLackey, Kawanda Drumheller, RN Safety: Ability to remain free from injury will improve 12/16/2016 0558 - Progressing by Agnes LawrenceLackey, Anatasia Tino, RN Skin Integrity: Risk for impaired skin integrity will decrease 12/16/2016 0558 - Progressing by Agnes LawrenceLackey, Gardiner Espana, RN

## 2016-12-17 ENCOUNTER — Encounter (HOSPITAL_COMMUNITY): Payer: Self-pay | Admitting: *Deleted

## 2016-12-17 NOTE — Progress Notes (Signed)
4 Days Post-Op   Subjective/Chief Complaint: Still with pain in leg but improving   Objective: Vital signs in last 24 hours: Temp:  [98.9 F (37.2 C)-99.5 F (37.5 C)] 98.9 F (37.2 C) (12/09 0459) Pulse Rate:  [80-105] 80 (12/09 0459) Resp:  [16] 16 (12/09 0459) BP: (96-119)/(53-68) 112/62 (12/09 0459) SpO2:  [100 %] 100 % (12/09 0459) Last BM Date: (PTA)  Intake/Output from previous day: 12/08 0701 - 12/09 0700 In: -  Out: 2500 [Urine:2500] Intake/Output this shift: No intake/output data recorded.  Exam: Lungs clear Abdomen soft Left leg with less swelling, incisions clean, foot warm  Lab Results:  Recent Labs    12/14/16 1118 12/15/16 0459  WBC 7.9 8.4  HGB 7.3* 7.8*  HCT 22.5* 24.5*  PLT 155 185   BMET No results for input(s): NA, K, CL, CO2, GLUCOSE, BUN, CREATININE, CALCIUM in the last 72 hours. PT/INR No results for input(s): LABPROT, INR in the last 72 hours. ABG No results for input(s): PHART, HCO3 in the last 72 hours.  Invalid input(s): PCO2, PO2  Studies/Results: No results found.  Anti-infectives: Anti-infectives (From admission, onward)   Start     Dose/Rate Route Frequency Ordered Stop   12/13/16 1027  vancomycin (VANCOCIN) powder  Status:  Discontinued       As needed 12/13/16 1027 12/13/16 1058   12/13/16 0800  ceFAZolin (ANCEF) IVPB 2g/100 mL premix     2 g 200 mL/hr over 30 Minutes Intravenous On call to O.R. 12/13/16 0650 12/13/16 0840   12/12/16 0200  ceFAZolin (ANCEF) IVPB 2g/100 mL premix     2 g 200 mL/hr over 30 Minutes Intravenous Every 8 hours 12/12/16 0026 12/12/16 1754      Assessment/Plan: s/p Procedure(s): OPEN REDUCTION INTERNAL FIXATION (ORIF) TIBIA FRACTURE (Left)  Continue current care Dispo:  placement  LOS: 7 days    Tama Grosz A 12/17/2016
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
## 2016-12-18 NOTE — Plan of Care (Signed)
Progressing Health Behavior/Discharge Planning: Ability to manage health-related needs will improve 12/18/2016 1505 - Progressing by Darreld Mcleanox, Sylver Vantassell, RN 12/18/2016 1416 - Progressing by Darreld Mcleanox, Moe Graca, RN Clinical Measurements: Ability to maintain clinical measurements within normal limits will improve 12/18/2016 1505 - Progressing by Darreld Mcleanox, Oliviagrace Crisanti, RN 12/18/2016 1416 - Progressing by Darreld Mcleanox, Udell Blasingame, RN Will remain free from infection 12/18/2016 1505 - Progressing by Darreld Mcleanox, Yeiren Whitecotton, RN 12/18/2016 1416 - Progressing by Darreld Mcleanox, Jailyn Langhorst, RN Diagnostic test results will improve 12/18/2016 1505 - Progressing by Darreld Mcleanox, Sofi Bryars, RN 12/18/2016 1416 - Progressing by Darreld Mcleanox, Lorma Heater, RN Respiratory complications will improve 12/18/2016 1505 - Progressing by Darreld Mcleanox, Mohsin Crum, RN 12/18/2016 1416 - Progressing by Darreld Mcleanox, Jameek Bruntz, RN Cardiovascular complication will be avoided 12/18/2016 1505 - Progressing by Darreld Mcleanox, Shenia Alan, RN 12/18/2016 1416 - Progressing by Darreld Mcleanox, Hailee Hollick, RN Activity: Risk for activity intolerance will decrease 12/18/2016 1505 - Progressing by Darreld Mcleanox, Jamaree Hosier, RN 12/18/2016 1416 - Progressing by Darreld Mcleanox, Kourtnie Sachs, RN Nutrition: Adequate nutrition will be maintained 12/18/2016 1505 - Progressing by Darreld Mcleanox, Stevan Eberwein, RN 12/18/2016 1416 - Progressing by Darreld Mcleanox, Ahjanae Cassel, RN Coping: Level of anxiety will decrease 12/18/2016 1505 - Progressing by Darreld Mcleanox, Redith Drach, RN 12/18/2016 1416 - Progressing by Darreld Mcleanox, Tarry Blayney, RN Elimination: Will not experience complications related to bowel motility 12/18/2016 1505 - Progressing by Darreld Mcleanox, Leighann Amadon, RN 12/18/2016 1416 - Progressing by Darreld Mcleanox, Annick Dimaio, RN Will not experience complications related to urinary retention 12/18/2016 1505 - Progressing by Darreld Mcleanox, Annalina Needles, RN 12/18/2016 1416 - Progressing by Darreld Mcleanox, Mickey Hebel, RN Pain Managment: General experience of comfort will improve 12/18/2016 1505 - Progressing by Darreld Mcleanox, Jacquie Lukes, RN 12/18/2016 1416 - Progressing by Darreld Mcleanox, Chandlor Noecker, RN Safety: Ability to remain free from injury will improve 12/18/2016 1505 - Progressing  by Darreld Mcleanox, Kennita Pavlovich, RN 12/18/2016 1416 - Progressing by Darreld Mcleanox, Milano Rosevear, RN Skin Integrity: Risk for impaired skin integrity will decrease 12/18/2016 1505 - Progressing by Darreld Mcleanox, Joee Iovine, RN 12/18/2016 1416 - Progressing by Darreld Mcleanox, Nickolas Chalfin, RN Education: Verbalization of understanding the information provided (i.e., activity precautions, restrictions, etc) will improve 12/18/2016 1505 - Progressing by Darreld Mcleanox, Ileane Sando, RN 12/18/2016 1416 - Progressing by Darreld Mcleanox, Nesbit Michon, RN Activity: Ability to ambulate and perform ADLs will improve 12/18/2016 1505 - Progressing by Darreld Mcleanox, Yuvia Plant, RN 12/18/2016 1416 - Progressing by Darreld Mcleanox, Ottilie Wigglesworth, RN Clinical Measurements: Postoperative complications will be avoided or minimized 12/18/2016 1505 - Progressing by Darreld Mcleanox, Maricarmen Braziel, RN 12/18/2016 1416 - Progressing by Darreld Mcleanox, Pebble Botkin, RN Self-Concept: Ability to maintain and perform role responsibilities to the fullest extent possible will improve 12/18/2016 1505 - Progressing by Darreld Mcleanox, Tarique Loveall, RN 12/18/2016 1416 - Progressing by Darreld Mcleanox, Camp Gopal, RN Pain Management: Pain level will decrease 12/18/2016 1505 - Progressing by Darreld Mcleanox, Lain Tetterton, RN 12/18/2016 1416 - Progressing by Darreld Mcleanox, Sya Nestler, RN Activity: Ability to avoid complications of mobility impairment will improve 12/18/2016 1505 - Progressing by Darreld Mcleanox, Dimitrius Steedman, RN 12/18/2016 1416 - Progressing by Darreld Mcleanox, Jaquia Benedicto, RN Will remain free from falls 12/18/2016 1505 - Progressing by Darreld Mcleanox, Riata Ikeda, RN 12/18/2016 1416 - Progressing by Darreld Mcleanox, Cyprian Gongaware, RN Education: Knowledge of the prescribed therapeutic regimen will improve 12/18/2016 1505 - Progressing by Darreld Mcleanox, Jeni Duling, RN 12/18/2016 1416 - Progressing by Darreld Mcleanox, Ellah Otte, RN Understanding of discharge needs will improve 12/18/2016 1505 - Progressing by Darreld Mcleanox, Aarvi Stotts, RN 12/18/2016 1416 - Progressing by Darreld Mcleanox, Zephaniah Enyeart, RN Physical Regulation: Ability to maintain clinical measurements within normal limits will improve 12/18/2016 1505 - Progressing by Darreld Mcleanox, Venkat Ankney, RN 12/18/2016 1416 - Progressing by Darreld Mcleanox, Gwin Eagon,  RN Postoperative complications will be avoided or minimized 12/18/2016 1505 - Progressing by Darreld Mcleanox, Caidyn Henricksen, RN  12/18/2016 1416 - Progressing by Darreld Mcleanox, Kalima Saylor, RN Diagnostic test results will improve 12/18/2016 1505 - Progressing by Darreld Mcleanox, Humberto Addo, RN 12/18/2016 1416 - Progressing by Darreld Mcleanox, Bayler Gehrig, RN Pain Management: Pain level will decrease with appropriate interventions 12/18/2016 1505 - Progressing by Darreld Mcleanox, Fareed Fung, RN 12/18/2016 1416 - Progressing by Darreld Mcleanox, Declyn Offield, RN Skin Integrity: Signs of wound healing will improve 12/18/2016 1505 - Progressing by Darreld Mcleanox, Jadan Rouillard, RN 12/18/2016 1416 - Progressing by Darreld Mcleanox, Girl Schissler, RN
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
## 2016-12-18 NOTE — Plan of Care (Signed)
  Progressing Health Behavior/Discharge Planning: Ability to manage health-related needs will improve 12/18/2016 1416 - Progressing by Darreld Mcleanox, Amos Gaber, RN Clinical Measurements: Ability to maintain clinical measurements within normal limits will improve 12/18/2016 1416 - Progressing by Darreld Mcleanox, Jolonda Gomm, RN Will remain free from infection 12/18/2016 1416 - Progressing by Darreld Mcleanox, Aleem Elza, RN Diagnostic test results will improve 12/18/2016 1416 - Progressing by Darreld Mcleanox, Sylis Ketchum, RN Respiratory complications will improve 12/18/2016 1416 - Progressing by Darreld Mcleanox, Neamiah Sciarra, RN Cardiovascular complication will be avoided 12/18/2016 1416 - Progressing by Darreld Mcleanox, Deavon Podgorski, RN Activity: Risk for activity intolerance will decrease 12/18/2016 1416 - Progressing by Darreld Mcleanox, Mahogany Torrance, RN Nutrition: Adequate nutrition will be maintained 12/18/2016 1416 - Progressing by Darreld Mcleanox, Halbert Jesson, RN Coping: Level of anxiety will decrease 12/18/2016 1416 - Progressing by Darreld Mcleanox, Graiden Henes, RN Elimination: Will not experience complications related to bowel motility 12/18/2016 1416 - Progressing by Darreld Mcleanox, Tandrea Kommer, RN Will not experience complications related to urinary retention 12/18/2016 1416 - Progressing by Darreld Mcleanox, Karlin Heilman, RN Pain Managment: General experience of comfort will improve 12/18/2016 1416 - Progressing by Darreld Mcleanox, Palyn Scrima, RN Safety: Ability to remain free from injury will improve 12/18/2016 1416 - Progressing by Darreld Mcleanox, Aadhav Uhlig, RN Skin Integrity: Risk for impaired skin integrity will decrease 12/18/2016 1416 - Progressing by Darreld Mcleanox, Oluwatomisin Deman, RN Education: Verbalization of understanding the information provided (i.e., activity precautions, restrictions, etc) will improve 12/18/2016 1416 - Progressing by Darreld Mcleanox, Anistyn Graddy, RN Activity: Ability to ambulate and perform ADLs will improve 12/18/2016 1416 - Progressing by Darreld Mcleanox, Madeeha Costantino, RN Clinical Measurements: Postoperative complications will be avoided or minimized 12/18/2016 1416 - Progressing by Darreld Mcleanox, Jonathan Corpus, RN Self-Concept: Ability to  maintain and perform role responsibilities to the fullest extent possible will improve 12/18/2016 1416 - Progressing by Darreld Mcleanox, Dereona Kolodny, RN Pain Management: Pain level will decrease 12/18/2016 1416 - Progressing by Darreld Mcleanox, Vicenta Olds, RN Activity: Ability to avoid complications of mobility impairment will improve 12/18/2016 1416 - Progressing by Darreld Mcleanox, Balbina Depace, RN Will remain free from falls 12/18/2016 1416 - Progressing by Darreld Mcleanox, Aquarius Tremper, RN Education: Knowledge of the prescribed therapeutic regimen will improve 12/18/2016 1416 - Progressing by Darreld Mcleanox, Benetta Maclaren, RN Understanding of discharge needs will improve 12/18/2016 1416 - Progressing by Darreld Mcleanox, Falesha Schommer, RN Physical Regulation: Ability to maintain clinical measurements within normal limits will improve 12/18/2016 1416 - Progressing by Darreld Mcleanox, Abriel Geesey, RN Postoperative complications will be avoided or minimized 12/18/2016 1416 - Progressing by Darreld Mcleanox, Joesiah Lonon, RN Diagnostic test results will improve 12/18/2016 1416 - Progressing by Darreld Mcleanox, Holdyn Poyser, RN Pain Management: Pain level will decrease with appropriate interventions 12/18/2016 1416 - Progressing by Darreld Mcleanox, Creighton Longley, RN Skin Integrity: Signs of wound healing will improve 12/18/2016 1416 - Progressing by Darreld Mcleanox, Clemon Devaul, RN

## 2016-12-19 MED ORDER — ADULT MULTIVITAMIN W/MINERALS CH: 1.0000 | ORAL_TABLET | Freq: Every day | ORAL | Status: AC

## 2016-12-19 MED ORDER — THIAMINE HCL 100 MG PO TABS: 100.0000 mg | ORAL_TABLET | Freq: Every day | ORAL | Status: AC

## 2016-12-19 MED ORDER — ENSURE ENLIVE PO LIQD: 237.0000 mL | Freq: Two times a day (BID) | ORAL | 12 refills | Status: AC

## 2016-12-19 MED ORDER — FOLIC ACID 1 MG PO TABS: 1.0000 mg | ORAL_TABLET | Freq: Every day | ORAL | Status: AC

## 2016-12-19 MED ORDER — ENOXAPARIN SODIUM 40 MG/0.4ML ~~LOC~~ SOLN: 40.0000 mg | SUBCUTANEOUS | Status: DC

## 2016-12-19 MED ORDER — ACETAMINOPHEN 325 MG PO TABS: 650.0000 mg | ORAL_TABLET | Freq: Four times a day (QID) | ORAL | Status: AC | PRN

## 2016-12-19 MED ORDER — ENOXAPARIN SODIUM 40 MG/0.4ML ~~LOC~~ SOLN: 40.0000 mg | SUBCUTANEOUS | Status: AC

## 2016-12-19 MED ORDER — METOPROLOL TARTRATE 25 MG PO TABS: 12.5000 mg | ORAL_TABLET | Freq: Two times a day (BID) | ORAL | Status: AC

## 2016-12-19 MED ORDER — TRAMADOL HCL 50 MG PO TABS: 50.0000 mg | ORAL_TABLET | Freq: Four times a day (QID) | ORAL | 0 refills | Status: AC | PRN

## 2016-12-19 NOTE — Social Work (Signed)
CSW followed up with SNF-[facility] on bed availability as they are the only SNF that offered a bed.   CSW left message for [place] in admission.  CSW will continue to follow up.  Keene BreathPatricia Michelene Keniston, LCSW Clinical Social Worker [phone]

## 2016-12-19 NOTE — Clinical Social Work Placement (Signed)
   CLINICAL SOCIAL WORK PLACEMENT  NOTE  Date:  12/19/2016  Patient Details  Name: Stephen Rivera MRN: [id number] Date of Birth: 08/01/1950  Clinical Social Work is seeking post-discharge placement for this patient at the Skilled  Nursing Facility level of care (*CSW will initial, date and re-position this form in  chart as items are completed):  Yes   Patient/family provided with [facility] Clinical Social Work Department's list of facilities offering this level of care within the geographic area requested by the patient (or if unable, by the patient's family).  Yes   Patient/family informed of their freedom to choose among providers that offer the needed level of care, that participate in Medicare, Medicaid or managed care program needed by the patient, have an available bed and are willing to accept the patient.  Yes   Patient/family informed of [facility]'s ownership interest in [facility] and [facility], as well as of the fact that they are under no obligation to receive care at these facilities.  PASRR submitted to EDS on       PASRR number received on 12/15/16     Existing PASRR number confirmed on       FL2 transmitted to all facilities in geographic area requested by pt/family on 12/15/16     FL2 transmitted to all facilities within larger geographic area on       Patient informed that his/her managed care company has contracts with or will negotiate with certain facilities, including the following:        Yes   Patient/family informed of bed offers received.  Patient chooses bed at [facility]     Physician recommends and patient chooses bed at      Patient to be transferred to [facility] on 12/19/16.  Patient to be transferred to facility by [facility]     Patient family notified on 12/19/16 of transfer.  Name of family member notified:  spoke to family member Stephen Rivera     PHYSICIAN Please prepare prescriptions     Additional Comment:     _______________________________________________ Tresa MoorePatricia V Akif Weldy, LCSW 12/19/2016, 12:59 PM

## 2016-12-19 NOTE — Progress Notes (Signed)
Report called to Reece LeaderJanella, Charity fundraiserN at [facility]. Discharge paperwork complete.

## 2016-12-19 NOTE — Social Work (Signed)
Clinical Social Worker facilitated patient discharge including contacting patient family and facility to confirm patient discharge plans.  Clinical information faxed to facility and family agreeable with plan.    CSW arranged ambulance transport via [facility] to [facility].    RN to call [phone] to give report prior to discharge.  Clinical Social Worker will sign off for now as social work intervention is no longer needed. Please consult us again if new need arises.  Mykelle Cockerell, LCSW Clinical Social Worker [phone]    
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
## 2016-12-19 NOTE — Care Management Important Message (Signed)
Important Message  Patient Details  Name: Damita Dunningsed M Petre MRN: [id number] Date of Birth: 02/19/1950   Medicare Important Message Given:  Yes    Lawerance Sabalebbie Terianna Peggs, RN 12/19/2016, 1:25 PM

## 2016-12-19 NOTE — Social Work (Signed)
CSW received call back from [place]ld[place]Kelly in admissions confirming the bed offers.  CSW discussed with patient and he is in agreement.   CSW will f/u for dc summary.  Keene BreathPatricia Amarien Carne, LCSW Clinical Social Worker [phone]
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
## 2016-12-20 ENCOUNTER — Encounter: Payer: Self-pay | Admitting: Emergency Medicine

## 2016-12-26 DIAGNOSIS — R262 Difficulty in walking, not elsewhere classified: Secondary | ICD-10-CM | POA: Diagnosis not present

## 2016-12-26 DIAGNOSIS — S72001A Fracture of unspecified part of neck of right femur, initial encounter for closed fracture: Secondary | ICD-10-CM | POA: Diagnosis not present

## 2016-12-26 DIAGNOSIS — S82202A Unspecified fracture of shaft of left tibia, initial encounter for closed fracture: Secondary | ICD-10-CM | POA: Diagnosis not present

## 2016-12-26 DIAGNOSIS — S82402A Unspecified fracture of shaft of left fibula, initial encounter for closed fracture: Secondary | ICD-10-CM | POA: Diagnosis not present

## 2016-12-28 DIAGNOSIS — M79605 Pain in left leg: Secondary | ICD-10-CM | POA: Diagnosis not present

## 2017-01-04 DIAGNOSIS — S72141D Displaced intertrochanteric fracture of right femur, subsequent encounter for closed fracture with routine healing: Secondary | ICD-10-CM | POA: Diagnosis not present

## 2017-01-04 DIAGNOSIS — S82202D Unspecified fracture of shaft of left tibia, subsequent encounter for closed fracture with routine healing: Secondary | ICD-10-CM | POA: Diagnosis not present

## 2017-01-10 DIAGNOSIS — J939 Pneumothorax, unspecified: Secondary | ICD-10-CM | POA: Diagnosis not present

## 2017-02-06 DIAGNOSIS — S82202D Unspecified fracture of shaft of left tibia, subsequent encounter for closed fracture with routine healing: Secondary | ICD-10-CM | POA: Diagnosis not present

## 2017-02-06 DIAGNOSIS — S72141D Displaced intertrochanteric fracture of right femur, subsequent encounter for closed fracture with routine healing: Secondary | ICD-10-CM | POA: Diagnosis not present

## 2017-03-27 DIAGNOSIS — S72141D Displaced intertrochanteric fracture of right femur, subsequent encounter for closed fracture with routine healing: Secondary | ICD-10-CM | POA: Diagnosis not present

## 2017-03-27 DIAGNOSIS — S82202D Unspecified fracture of shaft of left tibia, subsequent encounter for closed fracture with routine healing: Secondary | ICD-10-CM | POA: Diagnosis not present

## 2017-06-18 DIAGNOSIS — F4310 Post-traumatic stress disorder, unspecified: Secondary | ICD-10-CM | POA: Diagnosis not present

## 2018-05-03 IMAGING — DX DG TIBIA/FIBULA PORT 2V*L*
4 series · 4 of 4 positions shown · non-contrast
Comparison: Operative imaging same day.

CLINICAL DATA: Followup ORIF.

EXAM:
PORTABLE LEFT TIBIA AND FIBULA - 2 VIEW

[tibia ap (1 of 2)]
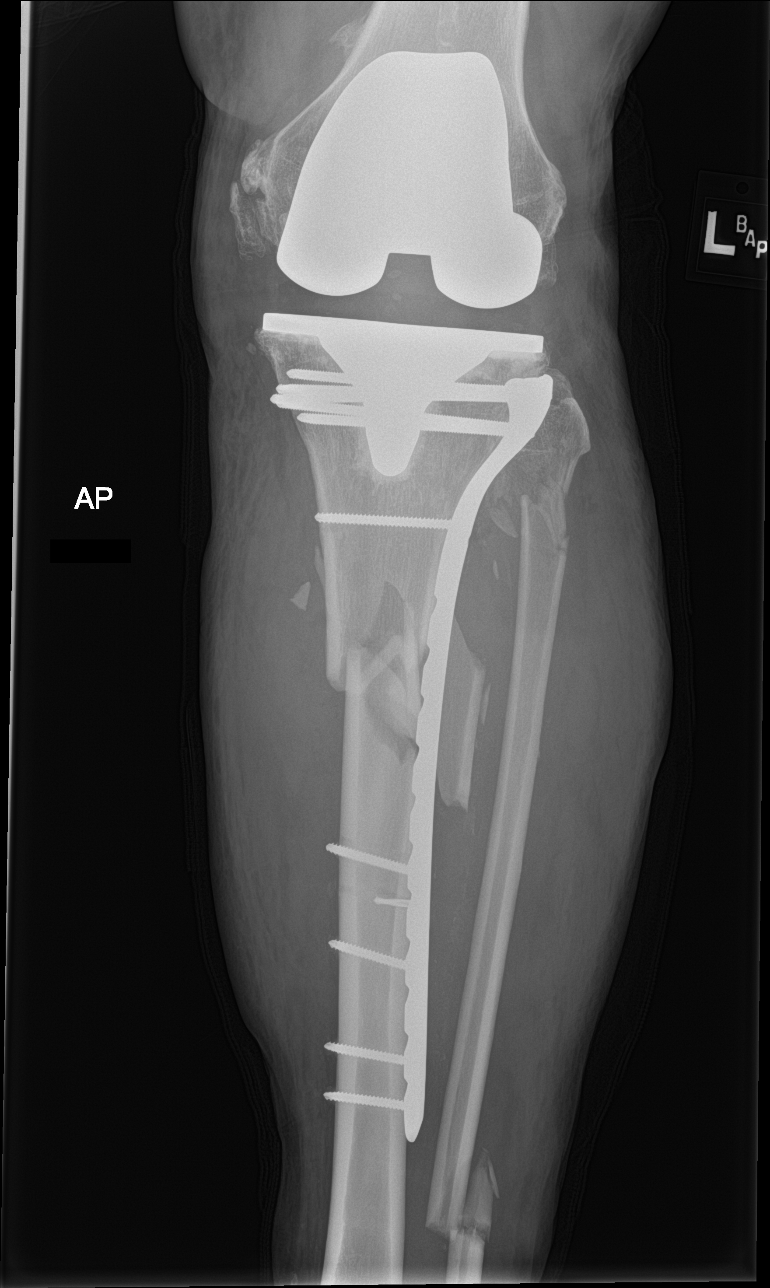

[tibia ap (2 of 2)]
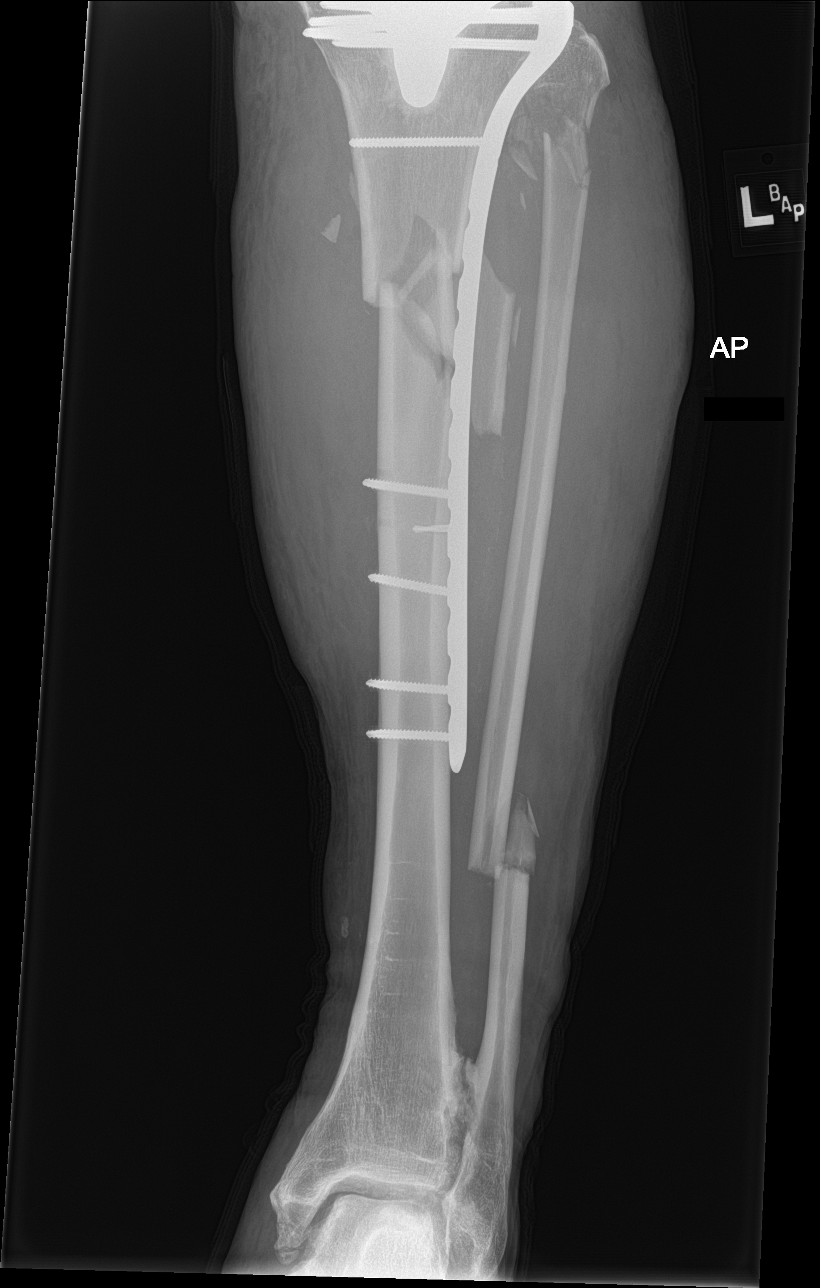

[tibia lat (1 of 2)]
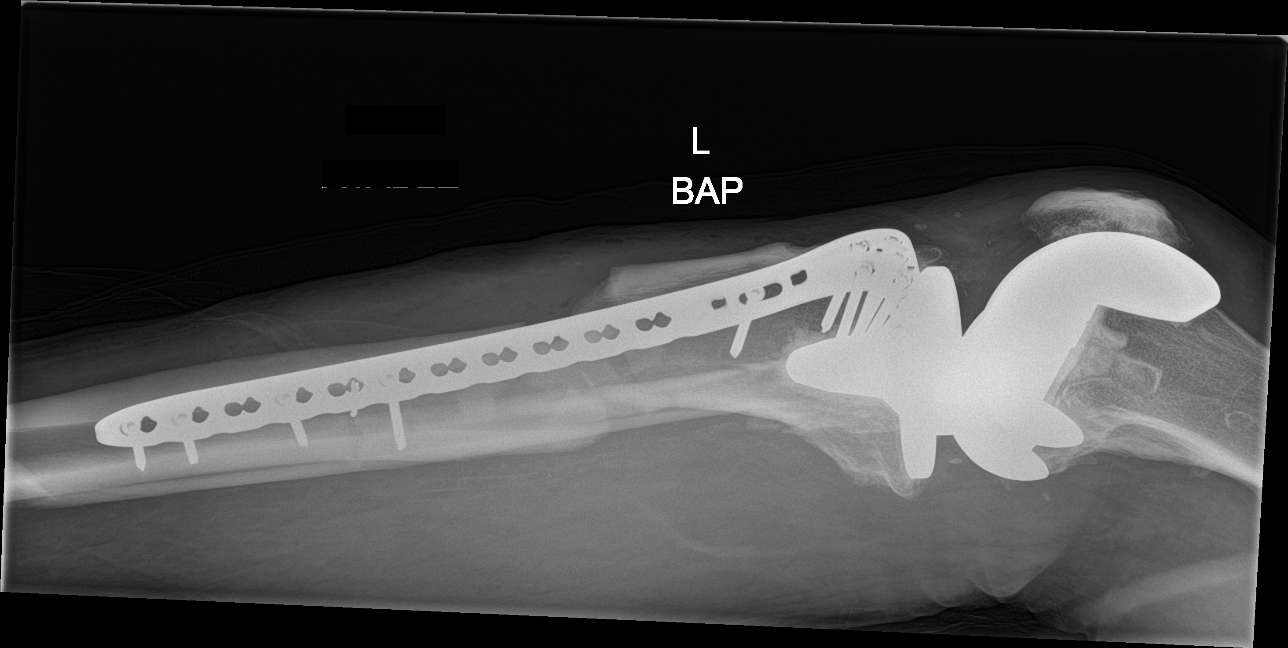

[tibia lat (2 of 2)]
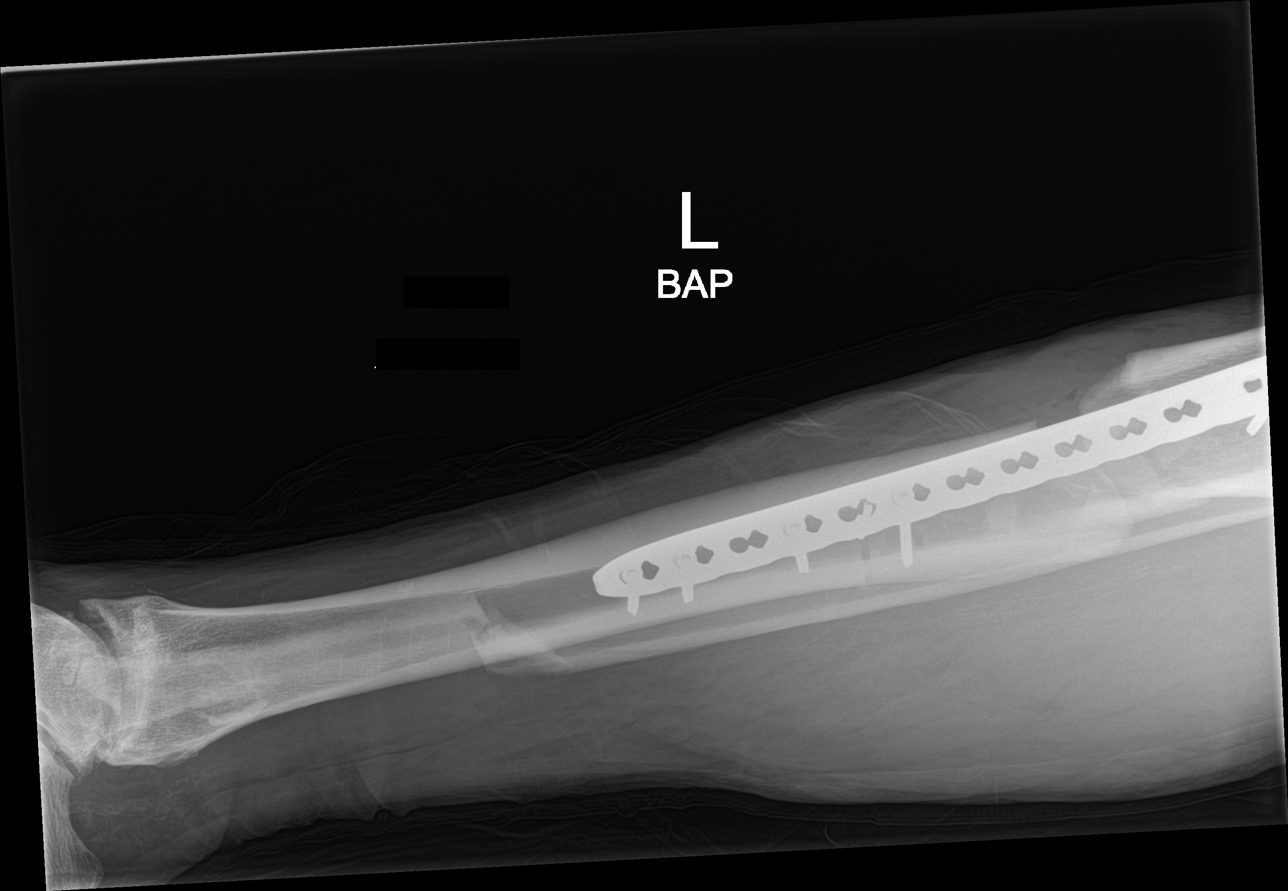

[4 of 4 positions shown; findings below may reference images not displayed]

FINDINGS: ORIF of comminuted proximal tibial diaphyseal fracture with lateral
plate and screws. Hardware is well positioned. Proximal component
screws in the metaphyseal region placed anterior to the prosthetic
tibial stem. Reasonably anatomic alignment at the comminuted
fracture site. A few small displaced fragments remain. Slight
cortical offset, but the overall axis of the bone is satisfactory.
Comminuted segmental fracture of the fibula as seen previously, not
instrumented.
IMPRESSION: Satisfactory appearance following ORIF of comminuted proximal tibial
diaphyseal fracture.

## 2019-09-08 ENCOUNTER — Ambulatory Visit: Payer: Self-pay
